# Patient Record
Sex: Male | Born: 1980 | Race: White | Hispanic: No | Marital: Single | Smoking: Former smoker
Health system: Southern US, Community
[De-identification: ages and names within clinical notes are randomized; demographics above are authoritative.]

## PROBLEM LIST (undated history)

## (undated) DIAGNOSIS — C801 Malignant (primary) neoplasm, unspecified: Secondary | ICD-10-CM

## (undated) DIAGNOSIS — N5089 Other specified disorders of the male genital organs: Secondary | ICD-10-CM

---
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
## 2014-01-16 ENCOUNTER — Other Ambulatory Visit: Payer: Self-pay | Admitting: Urology

## 2014-01-22 ENCOUNTER — Encounter (HOSPITAL_BASED_OUTPATIENT_CLINIC_OR_DEPARTMENT_OTHER): Payer: Self-pay | Admitting: *Deleted

## 2014-01-23 ENCOUNTER — Encounter (HOSPITAL_BASED_OUTPATIENT_CLINIC_OR_DEPARTMENT_OTHER): Payer: Self-pay | Admitting: *Deleted

## 2014-01-23 NOTE — Progress Notes (Signed)
NPO AFTER MN.  ARRIVE AT 1015.  NEEDS HG. 

## 2014-01-25 ENCOUNTER — Ambulatory Visit (HOSPITAL_BASED_OUTPATIENT_CLINIC_OR_DEPARTMENT_OTHER): Payer: Self-pay | Admitting: Anesthesiology

## 2014-01-25 ENCOUNTER — Encounter (HOSPITAL_BASED_OUTPATIENT_CLINIC_OR_DEPARTMENT_OTHER)
Admission: RE | Disposition: A | Discharge status: Home / Self Care | Payer: Self-pay | Source: Ambulatory Visit | Attending: Urology

## 2014-01-25 ENCOUNTER — Encounter (HOSPITAL_BASED_OUTPATIENT_CLINIC_OR_DEPARTMENT_OTHER): Payer: Self-pay | Admitting: Certified Registered"

## 2014-01-25 ENCOUNTER — Encounter (HOSPITAL_BASED_OUTPATIENT_CLINIC_OR_DEPARTMENT_OTHER): Payer: Self-pay | Admitting: Anesthesiology

## 2014-01-25 ENCOUNTER — Ambulatory Visit (HOSPITAL_BASED_OUTPATIENT_CLINIC_OR_DEPARTMENT_OTHER)
Admission: RE | Admit: 2014-01-25 | Discharge: 2014-01-25 | Disposition: A | Discharge status: Home / Self Care | Payer: Self-pay | Source: Ambulatory Visit | Attending: Urology | Admitting: Urology

## 2014-01-25 DIAGNOSIS — C6290 Malignant neoplasm of unspecified testis, unspecified whether descended or undescended: Secondary | ICD-10-CM | POA: Insufficient documentation

## 2014-01-25 DIAGNOSIS — Z87891 Personal history of nicotine dependence: Secondary | ICD-10-CM | POA: Insufficient documentation

## 2014-01-25 HISTORY — DX: Other specified disorders of the male genital organs: N50.89

## 2014-01-25 HISTORY — PX: ORCHIECTOMY: SHX2116

## 2014-01-25 LAB — POCT HEMOGLOBIN-HEMACUE: Hemoglobin: 13.3 g/dL (ref 13.0–17.0)

## 2014-01-25 SURGERY — ORCHIECTOMY
Anesthesia: General | Laterality: Left

## 2014-01-25 MED ORDER — ACETAMINOPHEN 325 MG PO TABS
650.0000 mg | ORAL_TABLET | ORAL | Status: DC | PRN
Start: 2014-01-25 — End: 2014-01-25
  Filled 2014-01-25: qty 2

## 2014-01-25 MED ORDER — CEFAZOLIN SODIUM 1-5 GM-% IV SOLN
1.0000 g | INTRAVENOUS | Status: DC
  Filled 2014-01-25: qty 50

## 2014-01-25 MED ORDER — MORPHINE SULFATE 2 MG/ML IJ SOLN
2.0000 mg | INTRAMUSCULAR | Status: DC | PRN
  Filled 2014-01-25: qty 1

## 2014-01-25 MED ORDER — HYDROMORPHONE HCL PF 1 MG/ML IJ SOLN
INTRAMUSCULAR | Status: AC
  Filled 2014-01-25: qty 1

## 2014-01-25 MED ORDER — HYDROCODONE-ACETAMINOPHEN 5-325 MG PO TABS
1.0000 | ORAL_TABLET | ORAL | Status: DC | PRN
Start: 2014-01-25 — End: 2014-05-01

## 2014-01-25 MED ORDER — OXYCODONE HCL 5 MG PO TABS
ORAL_TABLET | ORAL | Status: AC
  Filled 2014-01-25: qty 1

## 2014-01-25 MED ORDER — MIDAZOLAM HCL 2 MG/2ML IJ SOLN
INTRAMUSCULAR | Status: AC
  Filled 2014-01-25: qty 2

## 2014-01-25 MED ORDER — ONDANSETRON HCL 4 MG/2ML IJ SOLN
INTRAMUSCULAR | Status: DC | PRN
  Administered 2014-01-25: 4 mg via INTRAVENOUS

## 2014-01-25 MED ORDER — PROPOFOL 10 MG/ML IV BOLUS
INTRAVENOUS | Status: DC | PRN
  Administered 2014-01-25: 250 mg via INTRAVENOUS

## 2014-01-25 MED ORDER — SODIUM CHLORIDE 0.9 % IV SOLN
250.0000 mL | INTRAVENOUS | Status: DC | PRN
  Filled 2014-01-25: qty 250

## 2014-01-25 MED ORDER — PROMETHAZINE HCL 25 MG/ML IJ SOLN
6.2500 mg | INTRAMUSCULAR | Status: DC | PRN
  Filled 2014-01-25: qty 1

## 2014-01-25 MED ORDER — DEXAMETHASONE SODIUM PHOSPHATE 4 MG/ML IJ SOLN
INTRAMUSCULAR | Status: DC | PRN
  Administered 2014-01-25: 10 mg via INTRAVENOUS

## 2014-01-25 MED ORDER — ONDANSETRON HCL 4 MG/2ML IJ SOLN
4.0000 mg | Freq: Four times a day (QID) | INTRAMUSCULAR | Status: DC | PRN
  Filled 2014-01-25: qty 2

## 2014-01-25 MED ORDER — OXYCODONE HCL 5 MG/5ML PO SOLN
5.0000 mg | Freq: Once | ORAL | Status: DC | PRN
  Filled 2014-01-25: qty 5

## 2014-01-25 MED ORDER — FENTANYL CITRATE 0.05 MG/ML IJ SOLN
INTRAMUSCULAR | Status: DC | PRN
  Administered 2014-01-25: 50 ug via INTRAVENOUS
  Administered 2014-01-25: 25 ug via INTRAVENOUS
  Administered 2014-01-25: 50 ug via INTRAVENOUS
  Administered 2014-01-25: 25 ug via INTRAVENOUS

## 2014-01-25 MED ORDER — OXYCODONE HCL 5 MG PO TABS
5.0000 mg | ORAL_TABLET | ORAL | Status: DC | PRN
  Administered 2014-01-25: 5 mg via ORAL
  Filled 2014-01-25: qty 2

## 2014-01-25 MED ORDER — MIDAZOLAM HCL 5 MG/5ML IJ SOLN
INTRAMUSCULAR | Status: DC | PRN
  Administered 2014-01-25: 2 mg via INTRAVENOUS

## 2014-01-25 MED ORDER — BUPIVACAINE HCL (PF) 0.25 % IJ SOLN
INTRAMUSCULAR | Status: DC | PRN
  Administered 2014-01-25: 10 mL

## 2014-01-25 MED ORDER — ACETAMINOPHEN 650 MG RE SUPP
650.0000 mg | RECTAL | Status: DC | PRN
Start: 2014-01-25 — End: 2014-01-25
  Filled 2014-01-25: qty 1

## 2014-01-25 MED ORDER — HYDROMORPHONE HCL PF 1 MG/ML IJ SOLN
0.2500 mg | INTRAMUSCULAR | Status: DC | PRN
  Administered 2014-01-25 (×2): 0.25 mg via INTRAVENOUS
  Filled 2014-01-25: qty 1

## 2014-01-25 MED ORDER — OXYCODONE HCL 5 MG PO TABS
5.0000 mg | ORAL_TABLET | Freq: Once | ORAL | Status: DC | PRN
  Filled 2014-01-25: qty 1

## 2014-01-25 MED ORDER — LIDOCAINE HCL (CARDIAC) 20 MG/ML IV SOLN
INTRAVENOUS | Status: DC | PRN
  Administered 2014-01-25: 80 mg via INTRAVENOUS

## 2014-01-25 MED ORDER — HYDROCODONE-ACETAMINOPHEN 5-325 MG PO TABS
1.0000 | ORAL_TABLET | ORAL | Status: DC | PRN
  Filled 2014-01-25: qty 2

## 2014-01-25 MED ORDER — MEPERIDINE HCL 25 MG/ML IJ SOLN
6.2500 mg | INTRAMUSCULAR | Status: DC | PRN
  Filled 2014-01-25: qty 1

## 2014-01-25 MED ORDER — SODIUM CHLORIDE 0.9 % IJ SOLN
3.0000 mL | INTRAMUSCULAR | Status: DC | PRN
  Filled 2014-01-25: qty 3

## 2014-01-25 MED ORDER — FENTANYL CITRATE 0.05 MG/ML IJ SOLN
INTRAMUSCULAR | Status: AC
  Filled 2014-01-25: qty 6

## 2014-01-25 MED ORDER — LACTATED RINGERS IV SOLN
INTRAVENOUS | Status: DC
  Administered 2014-01-25 (×2): via INTRAVENOUS
  Filled 2014-01-25: qty 1000

## 2014-01-25 MED ORDER — SODIUM CHLORIDE 0.9 % IJ SOLN
3.0000 mL | Freq: Two times a day (BID) | INTRAMUSCULAR | Status: DC
  Filled 2014-01-25: qty 3

## 2014-01-25 MED ORDER — CEFAZOLIN SODIUM-DEXTROSE 2-3 GM-% IV SOLR
2.0000 g | INTRAVENOUS | Status: AC
  Administered 2014-01-25: 2 g via INTRAVENOUS
  Filled 2014-01-25: qty 50

## 2014-01-25 SURGICAL SUPPLY — 42 items
APPLICATOR COTTON TIP 6IN STRL (MISCELLANEOUS) IMPLANT
BANDAGE GAUZE ELAST BULKY 4 IN (GAUZE/BANDAGES/DRESSINGS) ×3 IMPLANT
BLADE SURG 15 STRL LF DISP TIS (BLADE) ×1 IMPLANT
BLADE SURG 15 STRL SS (BLADE) ×2
BLADE SURG ROTATE 9660 (MISCELLANEOUS) ×3 IMPLANT
CLOTH BEACON ORANGE TIMEOUT ST (SAFETY) ×3 IMPLANT
COVER MAYO STAND STRL (DRAPES) ×3 IMPLANT
COVER TABLE BACK 60X90 (DRAPES) ×3 IMPLANT
DERMABOND ADVANCED (GAUZE/BANDAGES/DRESSINGS) ×2
DERMABOND ADVANCED .7 DNX12 (GAUZE/BANDAGES/DRESSINGS) ×1 IMPLANT
DISSECTOR ROUND CHERRY 3/8 STR (MISCELLANEOUS) ×3 IMPLANT
DRAPE PED LAPAROTOMY (DRAPES) ×3 IMPLANT
DRSG TEGADERM 4X4.75 (GAUZE/BANDAGES/DRESSINGS) ×3 IMPLANT
ELECT NEEDLE TIP 2.8 STRL (NEEDLE) ×3 IMPLANT
ELECT REM PT RETURN 9FT ADLT (ELECTROSURGICAL) ×3
ELECTRODE REM PT RTRN 9FT ADLT (ELECTROSURGICAL) ×1 IMPLANT
GAUZE SPONGE 4X4 16PLY XRAY LF (GAUZE/BANDAGES/DRESSINGS) ×3 IMPLANT
GLOVE BIO SURGEON STRL SZ8 (GLOVE) ×3 IMPLANT
GLOVE BIOGEL PI IND STRL 7.5 (GLOVE) ×2 IMPLANT
GLOVE BIOGEL PI INDICATOR 7.5 (GLOVE) ×4
GOWN STRL REIN XL XLG (GOWN DISPOSABLE) ×3 IMPLANT
GOWN STRL REUS W/TWL XL LVL3 (GOWN DISPOSABLE) ×6 IMPLANT
NEEDLE HYPO 25X5/8 SAFETYGLIDE (NEEDLE) ×3 IMPLANT
NS IRRIG 500ML POUR BTL (IV SOLUTION) ×3 IMPLANT
PACK BASIN DAY SURGERY FS (CUSTOM PROCEDURE TRAY) ×3 IMPLANT
PENCIL BUTTON HOLSTER BLD 10FT (ELECTRODE) ×3 IMPLANT
SPONGE GAUZE 4X4 12PLY (GAUZE/BANDAGES/DRESSINGS) ×3 IMPLANT
SUPPORT SCROTAL LG STRP (MISCELLANEOUS) ×2 IMPLANT
SUPPORTER ATHLETIC LG (MISCELLANEOUS) ×1
SUT CHROMIC 3 0 SH 27 (SUTURE) ×3 IMPLANT
SUT CHROMIC 4 0 SH 27 (SUTURE) ×3 IMPLANT
SUT MON AB 5-0 PS2 18 (SUTURE) ×3 IMPLANT
SUT SILK 0 TIES 10X30 (SUTURE) ×3 IMPLANT
SUT VICRYL 2 0 18  UND BR (SUTURE) ×2
SUT VICRYL 2 0 18 UND BR (SUTURE) ×1 IMPLANT
SYR BULB IRRIGATION 50ML (SYRINGE) ×3 IMPLANT
SYR CONTROL 10ML LL (SYRINGE) ×3 IMPLANT
TOWEL OR 17X24 6PK STRL BLUE (TOWEL DISPOSABLE) ×6 IMPLANT
TUBE CONNECTING 12'X1/4 (SUCTIONS) ×1
TUBE CONNECTING 12X1/4 (SUCTIONS) ×2 IMPLANT
WATER STERILE IRR 500ML POUR (IV SOLUTION) IMPLANT
YANKAUER SUCT BULB TIP NO VENT (SUCTIONS) ×3 IMPLANT

## 2014-01-25 NOTE — H&P (Signed)
Urology History and Physical Exam  CC: left testicular mass  HPI: 33 year old male presents at this time for left radical orchiectomy.  He presented last week with a painless swelling of his left testicle which she stated had only been that way for a week or 2.  Evaluation revealed a firm left testicle on examination, as well as significant changes with ultrasound.  Both beta hCG and alpha-fetoprotein levels were significantly elevated.  LDH was normal.  He presents at this time for surgical extirpation of his testicle and advance of CT chest abdomen and pelvis.    PMH: Past Medical History  Diagnosis Date  . Testicular mass     LEFT    PSH: Past Surgical History  Procedure Laterality Date  . Orif left ankle fx  1996    REMOVAL HARDWARE IN 1999    Allergies: Allergies  Allergen Reactions  . Amoxicillin Rash  . Erythromycin Rash    Medications: No prescriptions prior to admission     Social History: History   Social History  . Marital Status: Single    Spouse Name: N/A    Number of Children: N/A  . Years of Education: N/A   Occupational History  . Not on file.   Social History Main Topics  . Smoking status: Former Smoker -- 0.50 packs/day for 10 years    Types: Cigarettes    Quit date: 01/23/2013  . Smokeless tobacco: Never Used  . Alcohol Use: 1.0 oz/week    2 drink(s) per week  . Drug Use: No  . Sexual Activity: Not on file   Other Topics Concern  . Not on file   Social History Narrative  . No narrative on file    Family History: History reviewed. No pertinent family history.  Review of Systems: Genitourinary, constitutional, skin, eye, otolaryngeal, hematologic/lymphatic, cardiovascular, pulmonary, endocrine, musculoskeletal, gastrointestinal, neurological and psychiatric system(s) were reviewed and pertinent findings if present are noted.  Genitourinary: testicular pain and scrotal swelling.  ENT: sinus problems.  Hematologic/Lymphatic:  swollen glands.      Physical Exam: @VITALS2 @ Constitutional: Well nourished and well developed . No acute distress.  ENT:. The ears and nose are normal in appearance.  Neck: The appearance of the neck is normal and no neck mass is present.  Pulmonary: No respiratory distress and normal respiratory rhythm and effort.  Cardiovascular: Heart rate and rhythm are normal . No peripheral edema.  Abdomen: The abdomen is flat. The abdomen is soft and nontender. No masses are palpated. No CVA tenderness. No hernias are palpable. No hepatosplenomegaly noted.  Rectal: The prostate exam was deferred.  Genitourinary: Examination of the penis demonstrates no discharge, no masses and a normal meatus. The penis is circumcised. 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 M The scrotum is normal in appearance and without lesions. The right vas deferens is is palpably normal. The left vas deferens is palpably normal. The right epididymis is palpably normal and non-tender. The left epididymis is palpably normal and non-tender. The right spermatic cord is palpably normal. The left spermatic cord is palpably normal. The right testis is palpably normal, non-tender and without masses. The left testis is found to have a 3.5 cm mass, but non-tender.  Lymphatics: The femoral and inguinal nodes are not enlarged or tender.  Skin: Normal skin turgor, no visible rash and no visible skin lesions.  Neuro/Psych:. Mood and affect are appropriate.   Studies:  No results found  for this basename: HGB, WBC, PLT,  in the last 72 hours  No results found for this basename: NA, K, CL, CO2, BUN, CREATININE, CALCIUM, MAGNESIUM, GFRNONAA, GFRAA,  in the last 72 hours   No results found for this basename: PT, INR, APTT,  in the last 72 hours   No components found with this basename: ABG,     Assessment:  Left testicular mass consistent with carcinoma  Plan: Left radical orchiectomy.  Risks  and complications of the procedure have been discussed with the patient.  These include but are not limited to bleeding, infection, neuropathy from ileal inguinal nerve entrapment.  He understands these and desires to proceed.

## 2014-01-25 NOTE — Anesthesia Postprocedure Evaluation (Signed)
Anesthesia Post Note  Patient: Eric Wilkinson  Procedure(s) Performed: Procedure(s) (LRB): ORCHIECTOMY (Left)  Anesthesia type: General  Patient location: PACU  Post pain: Pain level controlled  Post assessment: Post-op Vital signs reviewed  Last Vitals: BP 122/58  Pulse 58  Temp(Src) 36.6 C (Oral)  Resp 6  Ht 6' 3.5" (1.918 m)  Wt 220 lb (99.791 kg)  BMI 27.13 kg/m2  SpO2 97%  Post vital signs: Reviewed  Level of consciousness: sedated  Complications: No apparent anesthesia complications

## 2014-01-25 NOTE — Op Note (Signed)
Preoperative diagnosis: left testicular mass, likely testicular carcinoma  Postoperative diagnosis: Same   Procedure: Left radical orchiectomy    Surgeon: Lillette Boxer. Tressia Labrum, M.D.   Anesthesia: Gen.   Complications: None  Specimen(s): Left testicle and cord  Drain(s): None  Indications: 33 year old male recently presenting with a left testicular mass. Evaluation included physical exam and ultrasound of the scrotum, both suspicious for cancer. Both beta hCG and alpha-fetoprotein were elevated. He presents at this time for left inguinal orchiectomy. He is aware of risks and complications and desires to proceed.    Technique and findings: The patient was identified in the holding area. He received preoperative IV antibiotics, his left side was marked appropriately. He was taken to the operating room where general anesthetic was administered with the LMA. He was placed in the recumbent position. Left lower abdomen, genitalia and perineum were prepped and draped, proper timeout was performed.  A 3 cm incision was made just overlying the left external inguinal ring, and using blunt dissection I carried this down to the left spermatic cord. The cord was identified, circumferentially dissected, and I then placed a half-inch Penrose drain tightly on the cord just at the inguinal ring. I then dissected inferiorly along the cord. Using external pressure on the scrotum, the left testicle was delivered into the wound. The gubernaculum was then divided carefully. I inspected the everted scrotum, as well as a inguinal floor. No bleeding was seen. I then infiltrated, using 3 cc of quarter percent Marcaine, the proximal cord. I then clamped the cord in 2 separate packages with Kelly clamps. I then divided distal to these clamps. The cord and testicle were sent for permanent specimen. I doubly ligated each cord packet with 0 silk ties. I then pushed the stump of the cord up into the inguinal canal. This was  easily achieved. I then reinspected the entire site of dissection. Adequate hemostasis was noted. 7 cc of quarter percent Marcaine was then used to infiltrate the subcutaneous and skin tissues. 3-0 chromic was used to reapproximate the subcutaneous tissues and a running simple fashion. I then reapproximated the skin edges using 5-0 Monocryl in a running subcuticular fashion. Dermabond was then placed on the skin. A fluffy dressing was then placed over the scrotum, as well as a scrotal support.  The patient tolerated procedure well. He was taken the PACU in stable condition. We will followup over the phone, ordering appropriate cross-sectional imaging, I will followup with biopsy results as well.

## 2014-01-25 NOTE — Transfer of Care (Signed)
Immediate Anesthesia Transfer of Care Note  Patient: Eric Wilkinson  Procedure(s) Performed: Procedure(s) (LRB): ORCHIECTOMY (Left)  Patient Location: PACU  Anesthesia Type: General  Level of Consciousness: awake, oriented, sedated and patient cooperative  Airway & Oxygen Therapy: Patient Spontanous Breathing and Patient connected to face mask oxygen  Post-op Assessment: Report given to PACU RN and Post -op Vital signs reviewed and stable  Post vital signs: Reviewed and stable  Complications: No apparent anesthesia complications

## 2014-01-25 NOTE — Discharge Instructions (Addendum)
1. Wear a scrotal support or tight briefs for a few days.  2. It is okay to shower tomorrow.  3. For mild pain, you can take Advil or Aleve. For more severe pain, you can take the prescription for hydrocodone  4. Limit your heavy exertion for about a week. You may return to work in 2-3 days if you're not uncomfortable. Just do not lift anything over about 20 pounds.  5. Dr. Alan Ripper office will call you with results of the specimen as well as followup/x-ray scheduling Post Anesthesia Home Care Instructions  Activity: Get plenty of rest for the remainder of the day. A responsible adult should stay with you for 24 hours following the procedure.  For the next 24 hours, DO NOT: -Drive a car -Paediatric nurse -Drink alcoholic beverages -Take any medication unless instructed by your physician -Make any legal decisions or sign important papers.  Meals: Start with liquid foods such as gelatin or soup. Progress to regular foods as tolerated. Avoid greasy, spicy, heavy foods. If nausea and/or vomiting occur, drink only clear liquids until the nausea and/or vomiting subsides. Call your physician if vomiting continues.  Special Instructions/Symptoms: Your throat may feel dry or sore from the anesthesia or the breathing tube placed in your throat during surgery. If this causes discomfort, gargle with warm salt water. The discomfort should disappear within 24 hours.

## 2014-01-25 NOTE — Anesthesia Procedure Notes (Signed)
Procedure Name: LMA Insertion Date/Time: 01/25/2014 11:39 AM Performed by: Denna Haggard D Pre-anesthesia Checklist: Patient identified, Emergency Drugs available, Suction available and Patient being monitored Patient Re-evaluated:Patient Re-evaluated prior to inductionOxygen Delivery Method: Circle System Utilized Preoxygenation: Pre-oxygenation with 100% oxygen Intubation Type: IV induction Ventilation: Mask ventilation without difficulty LMA: LMA inserted LMA Size: 5.0 Number of attempts: 1 Airway Equipment and Method: bite block Placement Confirmation: positive ETCO2 Tube secured with: Tape Dental Injury: Teeth and Oropharynx as per pre-operative assessment

## 2014-01-25 NOTE — Anesthesia Preprocedure Evaluation (Signed)
Anesthesia Evaluation  Patient identified by MRN, date of birth, ID band Patient awake    Reviewed: Allergy & Precautions, H&P , NPO status , Patient's Chart, lab work & pertinent test results  Airway Mallampati: I TM Distance: >3 FB Neck ROM: Full    Dental  (+) Dental Advisory Given   Pulmonary former smoker,  breath sounds clear to auscultation        Cardiovascular negative cardio ROS  Rhythm:Regular Rate:Normal     Neuro/Psych negative neurological ROS  negative psych ROS   GI/Hepatic negative GI ROS, Neg liver ROS,   Endo/Other  negative endocrine ROS  Renal/GU negative Renal ROS     Musculoskeletal negative musculoskeletal ROS (+)   Abdominal   Peds  Hematology negative hematology ROS (+)   Anesthesia Other Findings   Reproductive/Obstetrics                           Anesthesia Physical Anesthesia Plan  ASA: I  Anesthesia Plan: General   Post-op Pain Management:    Induction: Intravenous  Airway Management Planned: LMA  Additional Equipment:   Intra-op Plan:   Post-operative Plan: Extubation in OR  Informed Consent: I have reviewed the patients History and Physical, chart, labs and discussed the procedure including the risks, benefits and alternatives for the proposed anesthesia with the patient or authorized representative who has indicated his/her understanding and acceptance.   Dental advisory given  Plan Discussed with: CRNA  Anesthesia Plan Comments:         Anesthesia Quick Evaluation

## 2014-01-28 ENCOUNTER — Encounter (HOSPITAL_BASED_OUTPATIENT_CLINIC_OR_DEPARTMENT_OTHER): Payer: Self-pay | Admitting: Urology

## 2014-01-31 ENCOUNTER — Other Ambulatory Visit: Payer: Self-pay | Admitting: Urology

## 2014-01-31 DIAGNOSIS — C801 Malignant (primary) neoplasm, unspecified: Secondary | ICD-10-CM

## 2014-02-04 ENCOUNTER — Encounter (HOSPITAL_COMMUNITY): Payer: Self-pay

## 2014-02-04 ENCOUNTER — Ambulatory Visit (HOSPITAL_COMMUNITY)
Admission: RE | Admit: 2014-02-04 | Discharge: 2014-02-04 | Disposition: A | Discharge status: Home / Self Care | Payer: Self-pay | Source: Ambulatory Visit | Attending: Urology | Admitting: Urology

## 2014-02-04 DIAGNOSIS — C6290 Malignant neoplasm of unspecified testis, unspecified whether descended or undescended: Secondary | ICD-10-CM | POA: Insufficient documentation

## 2014-02-04 DIAGNOSIS — Z9079 Acquired absence of other genital organ(s): Secondary | ICD-10-CM | POA: Insufficient documentation

## 2014-02-04 DIAGNOSIS — C801 Malignant (primary) neoplasm, unspecified: Secondary | ICD-10-CM

## 2014-02-04 MED ORDER — IOHEXOL 300 MG/ML  SOLN
50.0000 mL | Freq: Once | INTRAMUSCULAR | Status: AC | PRN
  Administered 2014-02-04: 50 mL via ORAL

## 2014-02-04 MED ORDER — IOHEXOL 300 MG/ML  SOLN
100.0000 mL | Freq: Once | INTRAMUSCULAR | Status: AC | PRN
  Administered 2014-02-04: 100 mL via INTRAVENOUS

## 2014-04-24 ENCOUNTER — Other Ambulatory Visit: Payer: Self-pay | Admitting: Urology

## 2014-04-24 DIAGNOSIS — C801 Malignant (primary) neoplasm, unspecified: Secondary | ICD-10-CM

## 2014-04-25 ENCOUNTER — Telehealth: Payer: Self-pay | Admitting: Oncology

## 2014-04-25 NOTE — Telephone Encounter (Signed)
LEFT MESSAGE FOR PATIENT TO RETURN CALL TO SCHEDULE NP APPT.  °

## 2014-04-29 ENCOUNTER — Telehealth: Payer: Self-pay | Admitting: Oncology

## 2014-04-29 NOTE — Telephone Encounter (Signed)
S/W PATIENT AND GAVE NP APPT FOR 06/10 @ 1:30 W/DR. SHADAD.  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

## 2014-04-29 NOTE — Telephone Encounter (Signed)
C/D 04/29/14 for appt. 05/01/14

## 2014-05-01 ENCOUNTER — Telehealth: Payer: Self-pay | Admitting: Oncology

## 2014-05-01 ENCOUNTER — Encounter: Payer: Self-pay | Admitting: Oncology

## 2014-05-01 ENCOUNTER — Ambulatory Visit (HOSPITAL_COMMUNITY): Payer: Self-pay

## 2014-05-01 ENCOUNTER — Other Ambulatory Visit: Payer: Self-pay

## 2014-05-01 ENCOUNTER — Ambulatory Visit (HOSPITAL_BASED_OUTPATIENT_CLINIC_OR_DEPARTMENT_OTHER): Payer: Self-pay | Admitting: Oncology

## 2014-05-01 ENCOUNTER — Ambulatory Visit: Payer: Self-pay

## 2014-05-01 VITALS — BP 152/76 | HR 74 | Temp 97.5°F | Resp 20 | Ht 75.5 in | Wt 222.1 lb

## 2014-05-01 DIAGNOSIS — C6290 Malignant neoplasm of unspecified testis, unspecified whether descended or undescended: Secondary | ICD-10-CM | POA: Insufficient documentation

## 2014-05-01 NOTE — Telephone Encounter (Signed)
Gave pt appt for lab and MD for September 2015

## 2014-05-01 NOTE — Progress Notes (Signed)
Please see consult note.  

## 2014-05-01 NOTE — Consult Note (Signed)
Reason for Referral: Testicular cancer.   HPI: 33 year old gentleman native of [place] currently lives in [place] the last year and a half. He is a healthy gentleman without any significant past medical history. Around February of 2015 he presented with a large left testicular mass after a mild trauma. He was evaluated by her urging care and was subsequently referred to Dr. Luberta Robertson from urology. Testicular ultrasound revealed a suspicious mass for a malignancy. On 01/25/2014 he underwent a left radical orchiectomy which she tolerated fairly well. 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 No angiolymphatic invasion. And 30% embryonal, 45% yolk sac and 20% seminoma. The tumor was limited to the testes with a pathological staging of T1. He underwent a CT scan images in March of 2015 which showed no evidence of any lymphatic or any sort of metastasis. He recovered well from surgery and was referred to me for discussion for adjuvant therapy.  Clinically, he is asymptomatic. He does not report any headaches blurred vision double vision. Did not report any neurological symptoms of ulceration mental status, psychiatric issues or depression. He does not report any chest pain shortness of breath or cough or hemoptysis. Does not report any palpitation leg edema orthopnea. Does not report any nausea or vomiting abdominal pain. Has not reported any hematochezia or melena. Does not report any frequency urgency or hesitancy. Does not report any musculoskeletal complaints. Does not report any leg edema, lymphadenopathy or skin rashes. The rest of his review of systems unremarkable. 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   Past Medical History  Diagnosis Date  . Testicular mass     LEFT  :  Past Surgical History  Procedure Laterality Date  . Orif left ankle fx  [place]  . Orchiectomy Left 01/25/2014    Procedure: ORCHIECTOMY;  Surgeon: Franchot Gallo, MD;  Location: [facility];  Service: Urology;  Laterality: Left;  :  No current outpatient prescriptions on file.:  Allergies  Allergen Reactions  . Amoxicillin Rash  . Erythromycin Rash  :  No family history on file.:  History   Social History  . Marital Status: Single    Spouse Name: N/A    Number of Children: N/A  . Years of Education: N/A   Occupational History  . Not on file.   Social History Main Topics  . Smoking status: Former Smoker -- 0.50 packs/day for 10 years    Types: Cigarettes    Quit date: 01/23/2013  . Smokeless tobacco: Never Used  . Alcohol Use: 1.0 oz/week    2 drink(s) per week  . Drug Use: No  . Sexual Activity: Not on file   Other Topics Concern  . Not on file   Social History Narrative  . No narrative on file  :  Pertinent items are noted in HPI.  Exam: ECOG 0 Blood pressure 152/76, pulse 74, temperature 97.5 F (36.4 C), temperature source Oral, resp. rate 20, height 6' 3.5" (1.918 m), weight 222 lb 1.6 oz (100.744 kg). General appearance: alert and cooperative Head: Normocephalic, without obvious abnormality Throat: lips, mucosa, and tongue normal; teeth and gums normal Neck: no adenopathy Back: symmetric, no curvature. ROM normal. No CVA tenderness. Resp: clear to auscultation bilaterally Chest wall: no tenderness Cardio: regular rate and rhythm, S1, S2 normal, no murmur, click, rub or gallop  GI: soft, non-tender; bowel sounds normal; no masses,  no organomegaly Extremities: extremities normal, atraumatic, no cyanosis or edema Pulses: 2+ and symmetric Lymph nodes: Cervical, supraclavicular, and axillary nodes normal.    Assessment and Plan:    33 year old gentleman with the following issues:  1. 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 He presented with a left testicular mass and underwent an orchiectomy which  showed a mixed germ cell tumor. His embryonal component is about 30% without lymphovascular invasion. His clinical staging is T1 N0. His tumor markers showed an elevated alpha-fetoprotein of 874 prior to surgery and declined to 4.2 on 04/24/2014. His beta hCG was elevated at 79 preoperatively as well. The natural course of testicular cancer was discussed and the treatment options for early stage mixed germ cell tumor was discussed. After orchiectomy, the options in this situation would include observation and surveillance, retroperitoneal lymph node dissection and adjuvant chemotherapy with one cycle of BEP. The risks and benefits of all these approaches were discussed and I feel given the fact that he has no lymphovascular invasion and a low embryonal component, his risk of recurrence would be between 15 and 20%. Adjuvant therapy will over treat somebody like him more than 80% of the time. In this particular situation, especially in the setting of 3 months delay from his operation, I would recommend observation and surveillance. The complications of systemic chemotherapy were discussed briefly today including nausea, vomiting, myelosuppression, thrombosis and rarely severe illness and death. The details of observation and surveillance were discussed which include CT scans every 3-4 months and the first 2 years as well as clinical exams and serum tumor markers with frequency decreasing after that. Surveillance will have to be up to 5 years at least.  He is already scheduled for his CT scan in June I will communicate the results for him once I get.  He prefers to initiate active surveillance at the [facility] and I will set him up with his followup in September of 2015.   All his questions were answered today to his satisfaction.  2. Survivorship/health maintenance issues: I continue to advise him about age-appropriate cancer screening among health preventative processes.

## 2014-05-02 ENCOUNTER — Encounter (HOSPITAL_COMMUNITY): Payer: Self-pay

## 2014-05-02 ENCOUNTER — Ambulatory Visit (HOSPITAL_COMMUNITY)
Admission: RE | Admit: 2014-05-02 | Discharge: 2014-05-02 | Disposition: A | Discharge status: Home / Self Care | Payer: Self-pay | Source: Ambulatory Visit | Attending: Urology | Admitting: Urology

## 2014-05-02 DIAGNOSIS — C801 Malignant (primary) neoplasm, unspecified: Secondary | ICD-10-CM

## 2014-05-02 DIAGNOSIS — Z9079 Acquired absence of other genital organ(s): Secondary | ICD-10-CM | POA: Insufficient documentation

## 2014-05-02 DIAGNOSIS — J984 Other disorders of lung: Secondary | ICD-10-CM | POA: Insufficient documentation

## 2014-05-02 DIAGNOSIS — J438 Other emphysema: Secondary | ICD-10-CM | POA: Insufficient documentation

## 2014-05-02 DIAGNOSIS — C6290 Malignant neoplasm of unspecified testis, unspecified whether descended or undescended: Secondary | ICD-10-CM | POA: Insufficient documentation

## 2014-05-02 HISTORY — DX: Malignant (primary) neoplasm, unspecified: C80.1

## 2014-05-02 MED ORDER — IOHEXOL 300 MG/ML  SOLN
100.0000 mL | Freq: Once | INTRAMUSCULAR | Status: AC | PRN
  Administered 2014-05-02: 100 mL via INTRAVENOUS

## 2014-05-06 ENCOUNTER — Other Ambulatory Visit: Payer: Self-pay

## 2014-07-18 ENCOUNTER — Telehealth: Payer: Self-pay | Admitting: Oncology

## 2014-07-18 NOTE — Telephone Encounter (Signed)
Lft msg for pt r/s ov per MD sch, mailed sch to pt.Marland Kitchen..KJ

## 2014-08-06 ENCOUNTER — Ambulatory Visit (HOSPITAL_COMMUNITY): Payer: Self-pay

## 2014-08-06 ENCOUNTER — Other Ambulatory Visit: Payer: Self-pay

## 2014-08-09 ENCOUNTER — Telehealth: Payer: Self-pay | Admitting: Oncology

## 2014-08-09 NOTE — Telephone Encounter (Signed)
Pt called to r/s labs/ov due to work conflict, pt confirmed r/s apt.Marland Kitchen..KJ

## 2014-08-12 ENCOUNTER — Other Ambulatory Visit: Payer: Self-pay

## 2014-08-12 ENCOUNTER — Ambulatory Visit (HOSPITAL_COMMUNITY): Payer: Self-pay

## 2014-08-13 ENCOUNTER — Ambulatory Visit: Payer: Self-pay | Admitting: Oncology

## 2014-08-19 ENCOUNTER — Ambulatory Visit: Payer: Self-pay | Admitting: Oncology

## 2014-08-23 ENCOUNTER — Ambulatory Visit: Payer: Self-pay | Admitting: Oncology

## 2014-09-04 ENCOUNTER — Ambulatory Visit (HOSPITAL_COMMUNITY)
Admission: RE | Admit: 2014-09-04 | Discharge: 2014-09-04 | Disposition: A | Discharge status: Home / Self Care | Payer: Self-pay | Source: Ambulatory Visit | Attending: Oncology | Admitting: Oncology

## 2014-09-04 ENCOUNTER — Other Ambulatory Visit (HOSPITAL_BASED_OUTPATIENT_CLINIC_OR_DEPARTMENT_OTHER): Payer: Self-pay

## 2014-09-04 ENCOUNTER — Encounter (HOSPITAL_COMMUNITY): Payer: Self-pay

## 2014-09-04 DIAGNOSIS — C6290 Malignant neoplasm of unspecified testis, unspecified whether descended or undescended: Secondary | ICD-10-CM

## 2014-09-04 LAB — COMPREHENSIVE METABOLIC PANEL (CC13)
ALK PHOS: 47 U/L (ref 40–150)
ALT: 27 U/L (ref 0–55)
AST: 22 U/L (ref 5–34)
Albumin: 4.3 g/dL (ref 3.5–5.0)
Anion Gap: 8 mEq/L (ref 3–11)
BUN: 21.4 mg/dL (ref 7.0–26.0)
CHLORIDE: 106 meq/L (ref 98–109)
CO2: 26 mEq/L (ref 22–29)
Calcium: 10 mg/dL (ref 8.4–10.4)
Creatinine: 0.9 mg/dL (ref 0.7–1.3)
Glucose: 90 mg/dl (ref 70–140)
Potassium: 4.7 mEq/L (ref 3.5–5.1)
Sodium: 141 mEq/L (ref 136–145)
Total Bilirubin: 0.51 mg/dL (ref 0.20–1.20)
Total Protein: 7.9 g/dL (ref 6.4–8.3)

## 2014-09-04 LAB — CBC WITH DIFFERENTIAL/PLATELET
BASO%: 0.7 % (ref 0.0–2.0)
BASOS ABS: 0.1 10*3/uL (ref 0.0–0.1)
EOS%: 1.1 % (ref 0.0–7.0)
Eosinophils Absolute: 0.1 10*3/uL (ref 0.0–0.5)
HEMATOCRIT: 42.7 % (ref 38.4–49.9)
HEMOGLOBIN: 14.1 g/dL (ref 13.0–17.1)
LYMPH%: 18.7 % (ref 14.0–49.0)
MCH: 30.1 pg (ref 27.2–33.4)
MCHC: 32.9 g/dL (ref 32.0–36.0)
MCV: 91.3 fL (ref 79.3–98.0)
MONO#: 0.6 10*3/uL (ref 0.1–0.9)
MONO%: 7.4 % (ref 0.0–14.0)
NEUT#: 5.6 10*3/uL (ref 1.5–6.5)
NEUT%: 72.1 % (ref 39.0–75.0)
Platelets: 182 10*3/uL (ref 140–400)
RBC: 4.67 10*6/uL (ref 4.20–5.82)
RDW: 12.9 % (ref 11.0–14.6)
WBC: 7.8 10*3/uL (ref 4.0–10.3)
lymph#: 1.5 10*3/uL (ref 0.9–3.3)

## 2014-09-04 LAB — LACTATE DEHYDROGENASE (CC13): LDH: 120 U/L — AB (ref 125–245)

## 2014-09-04 MED ORDER — IOHEXOL 300 MG/ML  SOLN
100.0000 mL | Freq: Once | INTRAMUSCULAR | Status: AC | PRN
  Administered 2014-09-04: 100 mL via INTRAVENOUS

## 2014-09-07 LAB — BETA HCG QUANT (REF LAB): Beta hCG, Tumor Marker: <2 m[IU]/mL (ref <5.0)

## 2014-09-07 LAB — AFP TUMOR MARKER: AFP TUMOR MARKER: 4.2 ng/mL (ref <6.1)

## 2014-09-07 LAB — AFP TUMOR MARKER-PREVIOUS METHOD: AFP Tumor Marker: 3.3 ng/mL (ref 0.0–8.0)

## 2014-09-11 ENCOUNTER — Telehealth: Payer: Self-pay | Admitting: Oncology

## 2014-09-11 ENCOUNTER — Ambulatory Visit (HOSPITAL_BASED_OUTPATIENT_CLINIC_OR_DEPARTMENT_OTHER): Payer: Self-pay | Admitting: Oncology

## 2014-09-11 ENCOUNTER — Encounter: Payer: Self-pay | Admitting: Oncology

## 2014-09-11 VITALS — BP 131/63 | HR 73 | Temp 98.1°F | Resp 18 | Ht 75.5 in | Wt 222.8 lb

## 2014-09-11 DIAGNOSIS — C6290 Malignant neoplasm of unspecified testis, unspecified whether descended or undescended: Secondary | ICD-10-CM

## 2014-09-11 NOTE — Telephone Encounter (Signed)
gv adn psrinted appt sched and avs fo rpt for Feb adn March .Marland Kitchen...gv pt barium

## 2014-09-11 NOTE — Progress Notes (Signed)
Hematology and Oncology Follow Up Visit  Eric Wilkinson [id number] 02-21-1981 33 y.o. 09/11/2014 1:29 PM No PCP Per PatientNo ref. 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 He was found to have a T1 N0 with 30% embryonal component45% yolk sac and 20% seminoma without lymphovascular invasion. Diagnosed in March of 2015.   Prior Therapy: On 01/25/2014 he underwent a left radical orchiectomy    Current therapy: Observation and surveillance.  Interim History:  Eric Wilkinson presents today for a followup visit. Since his last visit, he is completely asymptomatic. He reports no change in his health or signs of cancer relapse. He did not report any inguinal masses or abnormal adenopathy. He was able to enjoy a vacation to [place] without issues. He does not report any headaches blurred vision double vision. Did not report any neurological symptoms of ulceration mental status, psychiatric issues or depression. He does not report any chest pain shortness of breath or cough or hemoptysis. Does not report any palpitation leg edema orthopnea. Does not report any nausea or vomiting abdominal pain. Has not reported any hematochezia or melena. Does not report any frequency urgency or hesitancy. Does not report any musculoskeletal complaints. Does not report any leg edema, lymphadenopathy or skin rashes. The rest of his review of systems unremarkable. 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    Medications: I have reviewed the patient's current medications.    Allergies:  Allergies  Allergen Reactions  . Amoxicillin Rash  . Erythromycin Rash    Past Medical History, Surgical history, Social history, and Family History were reviewed and updated.   Physical Exam: Blood pressure 131/63, pulse 73, temperature 98.1 F (36.7 C), temperature source Oral, resp. rate 18, height 6' 3.5" (1.918  m), weight 222 lb 12.8 oz (101.061 kg). ECOG: 0 General appearance: alert and cooperative Head: Normocephalic, without obvious abnormality Neck: no adenopathy Lymph nodes: Cervical, supraclavicular, and axillary nodes normal. Heart:regular rate and rhythm, S1, S2 normal, no murmur, click, rub or gallop Lung:chest clear, no wheezing, rales, normal symmetric air entry Abdomin: soft, non-tender, without masses or organomegaly EXT:no erythema, induration, or nodules   Lab Results: Lab Results  Component Value Date   WBC 7.8 09/04/2014   HGB 14.1 09/04/2014   HCT 42.7 09/04/2014   MCV 91.3 09/04/2014   PLT 182 09/04/2014     Chemistry      Component Value Date/Time   NA 141 09/04/2014 1041   K 4.7 09/04/2014 1041   CO2 26 09/04/2014 1041   BUN 21.4 09/04/2014 1041   CREATININE 0.9 09/04/2014 1041      Component Value Date/Time   CALCIUM 10.0 09/04/2014 1041   ALKPHOS 47 09/04/2014 1041   AST 22 09/04/2014 1041   ALT 27 09/04/2014 1041   BILITOT 0.51 09/04/2014 1041     Results for Eric Wilkinson, Eric Wilkinson (MRN [id number]) as of 09/11/2014 13:34  Ref. Range 09/04/2014 10:41 09/04/2014 10:41  AFP Tumor Marker Latest Range: <6.1 ng/mL 4.2 3.3  Beta hCG, Tumor Marker Latest Range: < 5.0 mIU/mL < 2.0     Radiological Studies:  EXAM:  CT CHEST, ABDOMEN, AND PELVIS WITH CONTRAST  TECHNIQUE:  Multidetector CT imaging of the chest, abdomen and pelvis was  performed following the standard protocol during bolus  administration of intravenous contrast.  CONTRAST: 173mL OMNIPAQUE IOHEXOL 300 MG/ML SOLN  COMPARISON: 05/02/2014  FINDINGS:  CT CHEST FINDINGS  Lungs/Pleura: No suspicious pulmonary nodule. Subtle interstitial  opacity at the right lung base is improved, including on image 50.  May have related to an area of infection on the prior exam.  No pleural fluid.  Heart/Mediastinum: No supraclavicular adenopathy. No axillary  adenopathy. 7 mm high left paratracheal node is unchanged  on image  9. Not pathologic by size criteria. No mediastinal or hilar  adenopathy. No central pulmonary embolism, on this non-dedicated  study. Normal aortic caliber without dissection. Normal heart size,  without pericardial effusion. Soft tissue density in the anterior  mediastinum is likely residual thymus.  CT ABDOMEN AND PELVIS FINDINGS  Hepatobiliary: Normal liver and gallbladder, without biliary ductal  dilatation.  Pancreas: Normal, without mass or pancreatic ductal dilatation.  Spleen: Normal  Urinary tract: Normal adrenal glands. Normal kidneys, without  hydronephrosis or hydroureter. Normal bladder.  Stomach/Bowel: Normal stomach, without wall thickening. Normal  colon, appendix, and terminal ileum. Normal small bowel without  abdominal ascites. No evidence of omental or peritoneal disease.  Vascular/Lymphatic: No aneurysm. No abdominopelvic adenopathy.  Reproductive: Normal prostate. Left orchiectomy.  Other: No significant free fluid. Tiny fat containing right inguinal  hernia.  Musculoskeletal: Tiny bone islands in the femoral heads. No  worrisome osseous lesion. Mild disc bulge at L4-5. Loss of  intervertebral disc height at the lumbosacral junction.  IMPRESSION:  1. Status post left orchiectomy, without evidence of metastatic  disease.  2. Improved appearance of the lower lobe predominant interstitial  thickening described on the prior exam. This was likely infectious.   Impression and Plan:   33 year old gentleman with the following issues:   1. 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 He presented with a left testicular mass and underwent an orchiectomy which showed a mixed germ cell tumor. His embryonal component is about 30% without lymphovascular invasion. His clinical staging is T1 N0. His tumor markers showed an elevated alpha-fetoprotein of 874 prior to surgery and declined to 4.2 on 04/24/2014. CT scan and tumor marker from 09/04/2014 were discussed  today and showed no evidence of disease. I plan on continuing active surveillance and repeat imaging studies and tumor markers in 4 months. This schedule will be continued in his second year of surveillance.  2. Followup: In 4 months.   QKMMNO,TRRNH, MD 10/21/20151:29 PM

## 2015-01-15 ENCOUNTER — Ambulatory Visit (HOSPITAL_COMMUNITY)
Admission: RE | Admit: 2015-01-15 | Discharge: 2015-01-15 | Disposition: A | Discharge status: Home / Self Care | Payer: 59 | Source: Ambulatory Visit | Attending: Oncology | Admitting: Oncology

## 2015-01-15 ENCOUNTER — Encounter (HOSPITAL_COMMUNITY): Payer: Self-pay

## 2015-01-15 ENCOUNTER — Other Ambulatory Visit (HOSPITAL_BASED_OUTPATIENT_CLINIC_OR_DEPARTMENT_OTHER): Payer: 59

## 2015-01-15 DIAGNOSIS — C6292 Malignant neoplasm of left testis, unspecified whether descended or undescended: Secondary | ICD-10-CM | POA: Diagnosis present

## 2015-01-15 DIAGNOSIS — Z9079 Acquired absence of other genital organ(s): Secondary | ICD-10-CM | POA: Diagnosis not present

## 2015-01-15 DIAGNOSIS — C6290 Malignant neoplasm of unspecified testis, unspecified whether descended or undescended: Secondary | ICD-10-CM

## 2015-01-15 LAB — CBC WITH DIFFERENTIAL/PLATELET
BASO%: 0.9 % (ref 0.0–2.0)
BASOS ABS: 0.1 10*3/uL (ref 0.0–0.1)
EOS ABS: 0 10*3/uL (ref 0.0–0.5)
EOS%: 0.8 % (ref 0.0–7.0)
HEMATOCRIT: 41.7 % (ref 38.4–49.9)
HEMOGLOBIN: 13.6 g/dL (ref 13.0–17.1)
LYMPH%: 26.7 % (ref 14.0–49.0)
MCH: 29.3 pg (ref 27.2–33.4)
MCHC: 32.7 g/dL (ref 32.0–36.0)
MCV: 89.6 fL (ref 79.3–98.0)
MONO#: 0.4 10*3/uL (ref 0.1–0.9)
MONO%: 6.7 % (ref 0.0–14.0)
NEUT%: 64.9 % (ref 39.0–75.0)
NEUTROS ABS: 4 10*3/uL (ref 1.5–6.5)
PLATELETS: 166 10*3/uL (ref 140–400)
RBC: 4.66 10*6/uL (ref 4.20–5.82)
RDW: 12.8 % (ref 11.0–14.6)
WBC: 6.2 10*3/uL (ref 4.0–10.3)
lymph#: 1.7 10*3/uL (ref 0.9–3.3)

## 2015-01-15 LAB — COMPREHENSIVE METABOLIC PANEL (CC13)
ALBUMIN: 4.5 g/dL (ref 3.5–5.0)
ALT: 26 U/L (ref 0–55)
ANION GAP: 10 meq/L (ref 3–11)
AST: 23 U/L (ref 5–34)
Alkaline Phosphatase: 44 U/L (ref 40–150)
BUN: 20.8 mg/dL (ref 7.0–26.0)
CHLORIDE: 106 meq/L (ref 98–109)
CO2: 25 meq/L (ref 22–29)
Calcium: 9.7 mg/dL (ref 8.4–10.4)
Creatinine: 0.8 mg/dL (ref 0.7–1.3)
EGFR: >90 mL/min/{1.73_m2} (ref >90)
Glucose: 89 mg/dl (ref 70–140)
POTASSIUM: 4.3 meq/L (ref 3.5–5.1)
SODIUM: 140 meq/L (ref 136–145)
TOTAL PROTEIN: 7.6 g/dL (ref 6.4–8.3)
Total Bilirubin: 0.48 mg/dL (ref 0.20–1.20)

## 2015-01-15 LAB — LACTATE DEHYDROGENASE (CC13): LDH: 123 U/L — AB (ref 125–245)

## 2015-01-15 MED ORDER — IOHEXOL 300 MG/ML  SOLN
100.0000 mL | Freq: Once | INTRAMUSCULAR | Status: AC | PRN
  Administered 2015-01-15: 100 mL via INTRAVENOUS

## 2015-01-20 LAB — AFP TUMOR MARKER-PREVIOUS METHOD: AFP Tumor Marker: 3.1 ng/mL (ref 0.0–8.0)

## 2015-01-20 LAB — AFP TUMOR MARKER: AFP-Tumor Marker: 3.1 ng/mL (ref <6.1)

## 2015-01-20 LAB — BETA HCG QUANT (REF LAB): Beta hCG, Tumor Marker: <2 m[IU]/mL (ref <5.0)

## 2015-01-22 ENCOUNTER — Ambulatory Visit (HOSPITAL_BASED_OUTPATIENT_CLINIC_OR_DEPARTMENT_OTHER): Payer: 59 | Admitting: Oncology

## 2015-01-22 ENCOUNTER — Telehealth: Payer: Self-pay | Admitting: Oncology

## 2015-01-22 VITALS — BP 138/78 | HR 79 | Temp 97.7°F | Resp 18 | Ht 75.5 in | Wt 230.8 lb

## 2015-01-22 DIAGNOSIS — C6290 Malignant neoplasm of unspecified testis, unspecified whether descended or undescended: Secondary | ICD-10-CM

## 2015-01-22 DIAGNOSIS — C6292 Malignant neoplasm of left testis, unspecified whether descended or undescended: Secondary | ICD-10-CM

## 2015-01-22 NOTE — Progress Notes (Signed)
Hematology and Oncology Follow Up Visit  Eric Wilkinson [id number] 1981-08-02 34 y.o. 01/22/2015 1:56 PM No PCP Per PatientNo ref. 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 He was found to have a T1 N0 with 30% embryonal component45% yolk sac and 20% seminoma without lymphovascular invasion. Diagnosed in March of 2015.   Prior Therapy: On 01/25/2014 he underwent a left radical orchiectomy    Current therapy: Observation and surveillance.  Interim History:  Eric Wilkinson presents today for a followup visit. Since his last visit, he is doing very well at this time. He has not reported any new complaints since last visit. He did not report any inguinal masses or abnormal adenopathy. He continues to work full time and perform activities of daily living without any decline.  He does not report any headaches blurred vision double vision. Did not report any neurological symptoms of ulceration mental status, psychiatric issues or depression. He does not report any chest pain shortness of breath or cough or hemoptysis. Does not report any palpitation leg edema orthopnea. Does not report any nausea or vomiting abdominal pain. Has not reported any hematochezia or melena. Does not report any frequency urgency or hesitancy. Does not report any musculoskeletal complaints. Does not report any leg edema, lymphadenopathy or skin rashes. The rest of his review of systems unremarkable. 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    Medications: I have reviewed the patient's current medications.    Allergies:  Allergies  Allergen Reactions  . Amoxicillin Rash  . Erythromycin Rash    Past Medical History, Surgical history, Social history, and Family History were reviewed and updated.   Physical Exam: Blood pressure 138/78, pulse 79, temperature 97.7 F (36.5 C), temperature source Oral, resp.  rate 18, height 6' 3.5" (1.918 m), weight 230 lb 12.8 oz (104.69 kg), SpO2 100 %. ECOG: 0 General appearance: alert and cooperative Head: Normocephalic, without obvious abnormality Neck: no adenopathy Lymph nodes: Cervical, supraclavicular, and axillary nodes normal. Heart:regular rate and rhythm, S1, S2 normal, no murmur, click, rub or gallop Lung:chest clear, no wheezing, rales, normal symmetric air entry Abdomin: soft, non-tender, without masses or organomegaly EXT:no erythema, induration, or nodules   Lab Results: Lab Results  Component Value Date   WBC 6.2 01/15/2015   HGB 13.6 01/15/2015   HCT 41.7 01/15/2015   MCV 89.6 01/15/2015   PLT 166 01/15/2015     Chemistry      Component Value Date/Time   NA 140 01/15/2015 1218   K 4.3 01/15/2015 1218   CO2 25 01/15/2015 1218   BUN 20.8 01/15/2015 1218   CREATININE 0.8 01/15/2015 1218      Component Value Date/Time   CALCIUM 9.7 01/15/2015 1218   ALKPHOS 44 01/15/2015 1218   AST 23 01/15/2015 1218   ALT 26 01/15/2015 1218   BILITOT 0.48 01/15/2015 1218       CLINICAL DATA: Followup evaluation for a 34 year old male with history of testicular cancer diagnosed in March 2015 status post left-sided orchiectomy.  EXAM: CT CHEST, ABDOMEN, AND PELVIS WITH CONTRAST  TECHNIQUE: Multidetector CT imaging of the chest, abdomen and pelvis was performed following the standard protocol during bolus administration of intravenous contrast.  CONTRAST: 169mL OMNIPAQUE IOHEXOL 300 MG/ML SOLN  COMPARISON: CT of the chest, abdomen and pelvis 09/04/2014.  FINDINGS: CT CHEST FINDINGS  Mediastinum/Lymph Nodes: Heart size is normal. There is no significant  pericardial fluid, thickening or pericardial calcification. No pathologically enlarged mediastinal or hilar lymph nodes. Small amount of residual thymic tissue in the anterior mediastinum is unchanged and benign appearance. Esophagus is unremarkable in appearance. No  axillary lymphadenopathy.  Lungs/Pleura: No suspicious appearing pulmonary nodules or masses. No acute consolidative airspace disease. No pleural effusions. Multiple small subpleural blebs are noted in the apices of the lungs, presumably from mild paraseptal emphysema.  Musculoskeletal/Soft Tissues: There are no aggressive appearing lytic or blastic lesions noted in the visualized portions of the skeleton.  CT ABDOMEN AND PELVIS FINDINGS  Hepatobiliary: No cystic or solid hepatic lesions. No intra or extrahepatic biliary ductal dilatation. Gallbladder is normal in appearance.  Pancreas: Unremarkable.  Spleen: Unremarkable.  Adrenals/Urinary Tract: Bilateral kidneys and bilateral adrenal glands are normal in appearance. No hydroureteronephrosis. Urinary bladder is normal in appearance.  Stomach/Bowel: Normal appearance of the stomach. No pathologic dilatation of small bowel or colon. Normal appendix.  Vascular/Lymphatic: No significant atherosclerotic disease in the visualized abdominal or pelvic vasculature. No aneurysm or dissection. No lymphadenopathy noted in the abdomen or pelvis.  Reproductive: Postoperative changes of left orchiectomy. Prostate gland and seminal vesicles are normal in appearance.  Other: No significant volume of ascites. No pneumoperitoneum.  Musculoskeletal: There are no aggressive appearing lytic or blastic lesions noted in the visualized portions of the skeleton.  IMPRESSION: 1. Status post left orchiectomy. No findings to suggest metastatic disease to the chest, abdomen or pelvis. 2. Subpleural blebs in the apices of the lungs bilaterally, indicative of mild paraseptal emphysema. 3. No acute findings.   Impression and Plan:   34 year old gentleman with the following issues:   1. 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 He presented with a left testicular mass and underwent an orchiectomy which showed a mixed germ cell  tumor. His embryonal component is about 30% without lymphovascular invasion. His clinical staging is T1 N0.   His tumor markers showed an elevated alpha-fetoprotein of 874 prior to surgery and declined to 4.2 on 04/24/2014.   CT scan and tumor marker from  01/15/2015 were discussed today and showed no evidence of disease. I plan on continuing active surveillance and repeat imaging studies and tumor markers in 4 months. This schedule will be continued in his second year of surveillance.  2. Followup: In 4 months for a CT scan and laboratory testing.   Zola Button, MD 3/2/20161:56 PM

## 2015-01-22 NOTE — Telephone Encounter (Signed)
Gave avs & calendar for July. Also gave contrast for CT scan.

## 2015-03-17 IMAGING — CT CT ABD-PELV W/ CM
2 of 5 series · 14 of 46 positions shown, 18 images · IV contrast (OMNIPAQUE)
Comparison: 02/04/2014

CLINICAL DATA: Left orchiectomy for testicular cancer.

EXAM:
CT CHEST, ABDOMEN, AND PELVIS WITH CONTRAST
TECHNIQUE: Multidetector CT imaging of the chest, abdomen and pelvis was
performed following the standard protocol during bolus
administration of intravenous contrast.
CONTRAST:  100mL OMNIPAQUE IOHEXOL 300 MG/ML  SOLN

[Series 2: cap with st · axial · 0.79mm/px · z∈[-695,-75]mm · 11 of 145 slices shown, 15 images]
[im 14/145  soft-tissue]
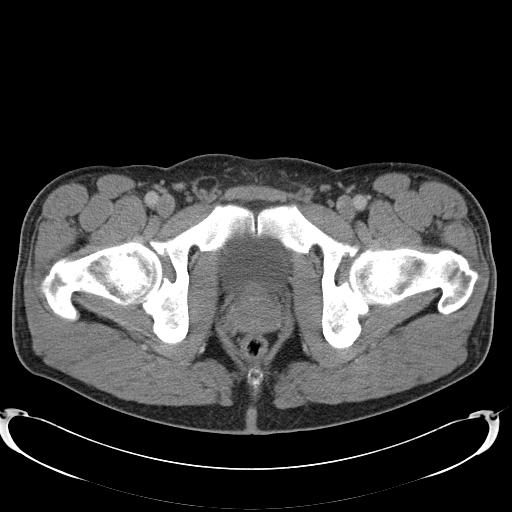
[im 14/145  bone]
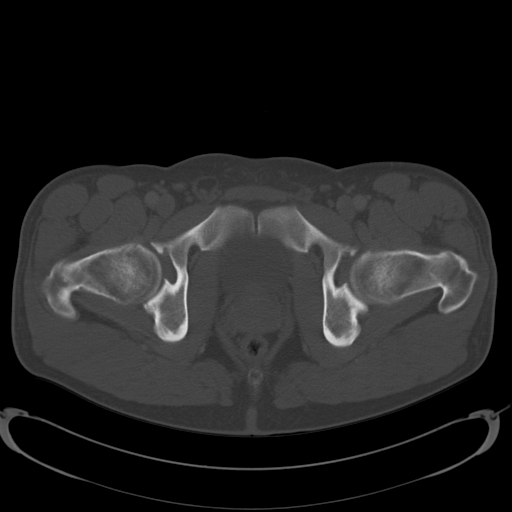
[im 28/145  soft-tissue]
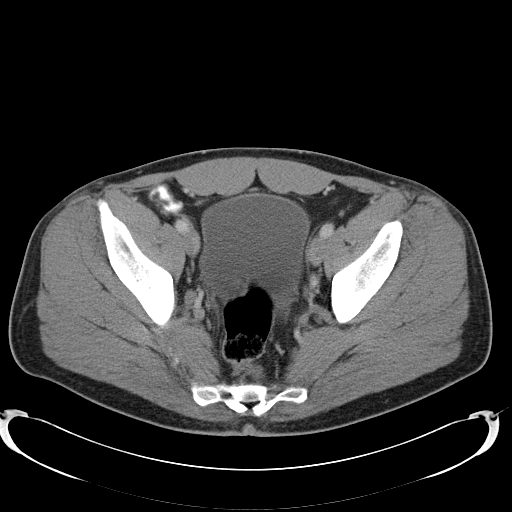
[im 42/145  soft-tissue]
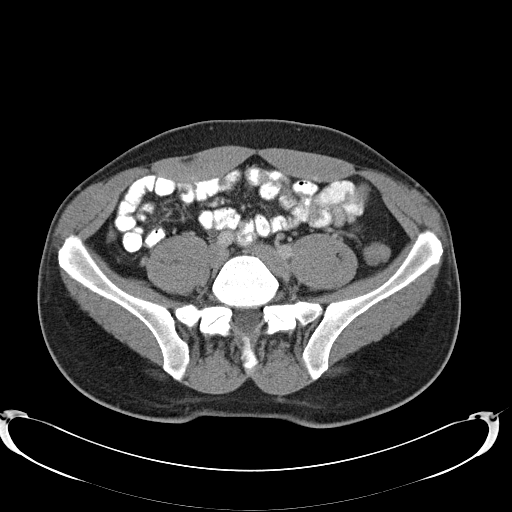
[im 55/145  soft-tissue]
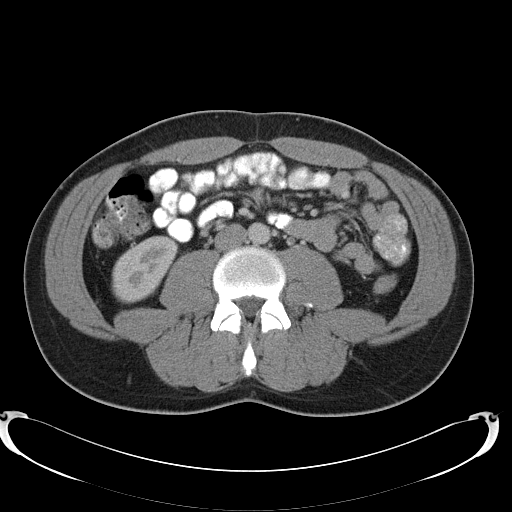
[im 76/145  soft-tissue]
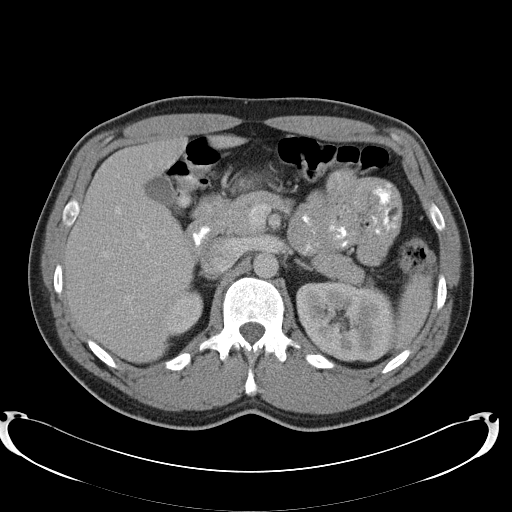
[im 90/145  soft-tissue]
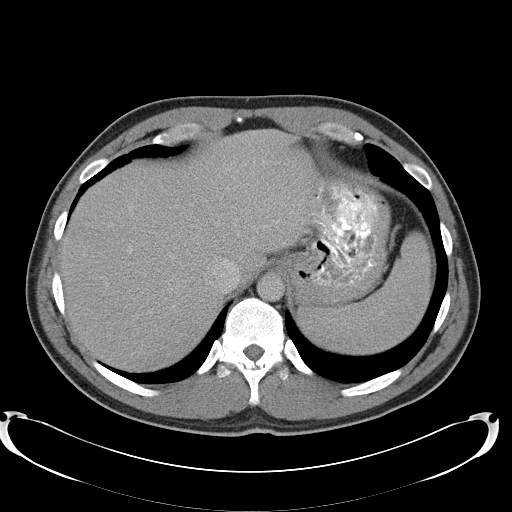
[im 103/145  soft-tissue]
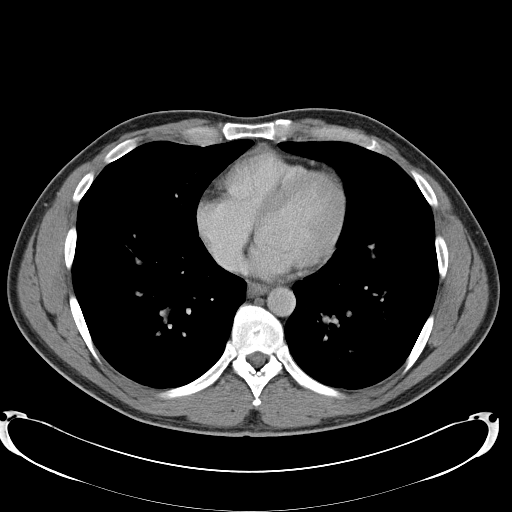
[im 117/145  soft-tissue]
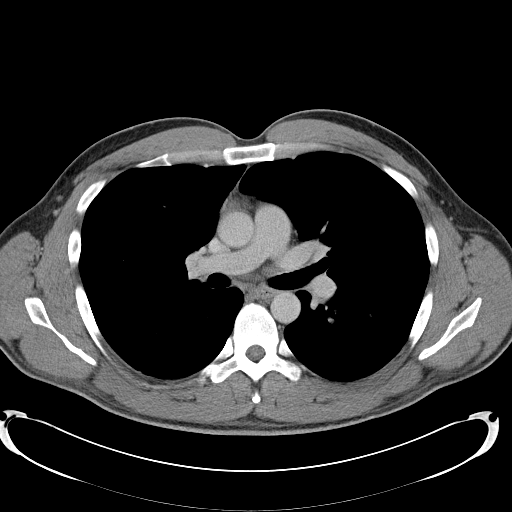
[im 117/145  lung]
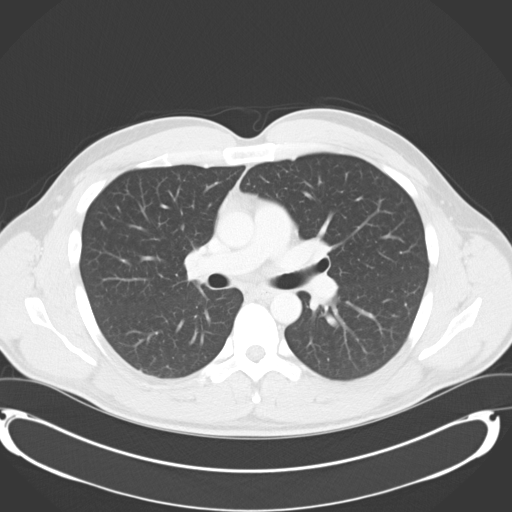
[im 124/145  lung]
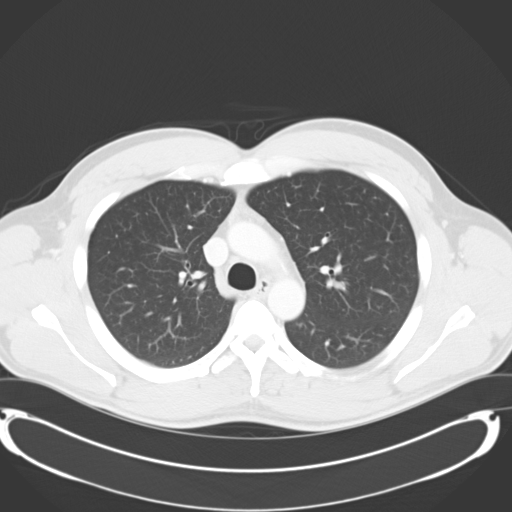
[im 131/145  soft-tissue]
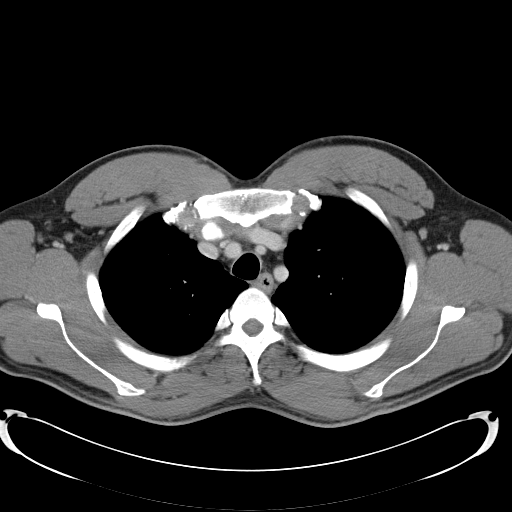
[im 131/145  lung]
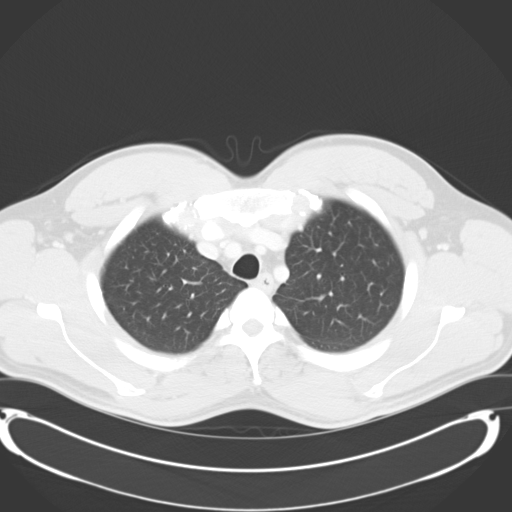
[im 131/145  bone]
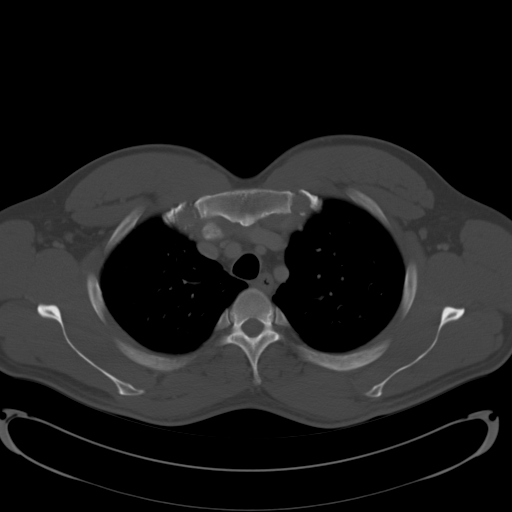
[im 138/145  lung]
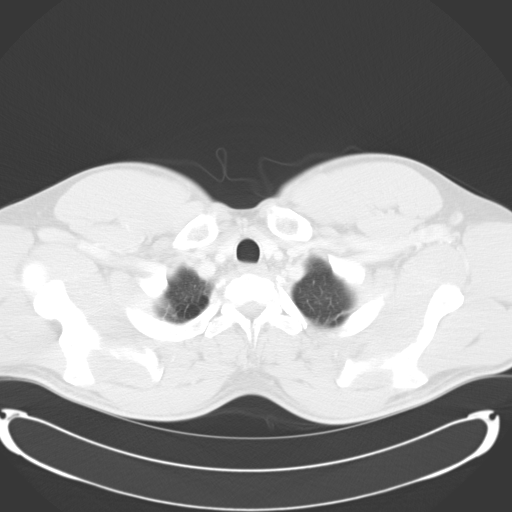

[Series 602: <mpr thick range> · coronal · 1.42mm/px · 3 of 86 slices shown]
[im 29/86  soft-tissue]
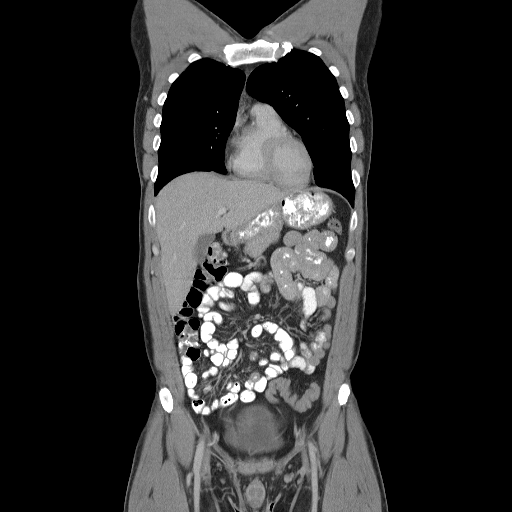
[im 38/86  soft-tissue]
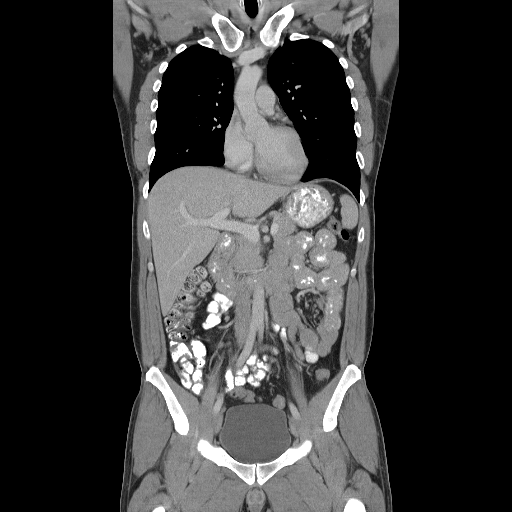
[im 48/86  soft-tissue]
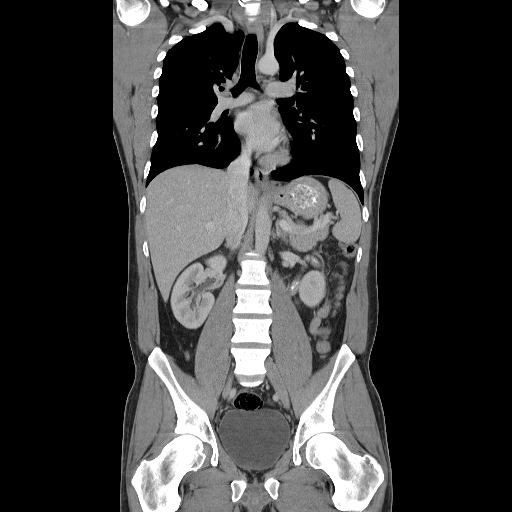

[14 of 46 positions shown; findings below may reference images not displayed]

FINDINGS: CT CHEST FINDINGS

Lungs/Pleura: Minimal paraseptal emphysema.

Mild lower lobe predominant interstitial thickening is felt to be
similar to slightly increased. No nodules or masses.

No pleural fluid.

Heart/Mediastinum: No supraclavicular adenopathy. Normal heart size,
without pericardial effusion. There is trace anterior pericardial
thickening which is unchanged. No central pulmonary embolism, on
this non-dedicated study. Similar small high left mediastinal node,
not pathologic by size criteria. Stable small middle mediastinal
nodes, without mediastinal or hilar adenopathy.

Soft tissue density in the anterior mediastinum is likely residual
thymus.

CT ABDOMEN AND PELVIS FINDINGS

Abdomen/Pelvis: Similar mild prominence of the right lobe of the
liver, without focal liver lesion.

Normal spleen, stomach, pancreas, gallbladder, biliary tract,
adrenal glands, kidneys.

No retroperitoneal or retrocrural adenopathy.

Normal colon, appendix, and terminal ileum. Normal small bowel
without abdominal ascites. Tiny fat containing right inguinal
hernia. No pelvic adenopathy. Left orchiectomy.

Bones/Musculoskeletal: No acute osseous abnormality.
IMPRESSION: .

1. Status post right orchiectomy, without evidence of acute process
or metastatic disease within the chest, abdomen, or pelvis.
2. Mild lower lobe predominant interstitial thickening. Favored to
be the sequelae of prior smoking/chronic bronchitis. This is felt to
be similar versus minimally increased. Correlate with symptoms to
suggest acute infection.

## 2015-05-22 ENCOUNTER — Telehealth: Payer: Self-pay | Admitting: Oncology

## 2015-05-22 NOTE — Telephone Encounter (Signed)
Due to PAL moved 7/8 f/u to 7/7. Appointment for lab 7/6 remains the same. Contacted Kenisha at [facility] alerted her that patient ct has not been scheduled and asking that someone contact patient re ct. Left message for patient re new date/time for f/u 7/6 and confirmed lab for 7/6. Also gave number for [facility] and asked that patient contact them re ct to be scheduled prior to seeing FS 7/7. Schedule mailed.

## 2015-05-28 ENCOUNTER — Other Ambulatory Visit: Payer: 59

## 2015-05-28 ENCOUNTER — Ambulatory Visit (HOSPITAL_COMMUNITY): Payer: BLUE CROSS/BLUE SHIELD

## 2015-05-29 ENCOUNTER — Ambulatory Visit: Payer: 59 | Admitting: Oncology

## 2015-05-30 ENCOUNTER — Ambulatory Visit: Payer: 59 | Admitting: Oncology

## 2015-06-26 ENCOUNTER — Ambulatory Visit (HOSPITAL_COMMUNITY)
Admission: RE | Admit: 2015-06-26 | Discharge: 2015-06-26 | Disposition: A | Discharge status: Home / Self Care | Payer: BLUE CROSS/BLUE SHIELD | Source: Ambulatory Visit | Attending: Oncology | Admitting: Oncology

## 2015-06-26 ENCOUNTER — Encounter (HOSPITAL_COMMUNITY): Payer: Self-pay

## 2015-06-26 ENCOUNTER — Other Ambulatory Visit (HOSPITAL_BASED_OUTPATIENT_CLINIC_OR_DEPARTMENT_OTHER): Payer: BLUE CROSS/BLUE SHIELD

## 2015-06-26 DIAGNOSIS — C6292 Malignant neoplasm of left testis, unspecified whether descended or undescended: Secondary | ICD-10-CM | POA: Diagnosis present

## 2015-06-26 DIAGNOSIS — J439 Emphysema, unspecified: Secondary | ICD-10-CM | POA: Insufficient documentation

## 2015-06-26 DIAGNOSIS — R918 Other nonspecific abnormal finding of lung field: Secondary | ICD-10-CM | POA: Diagnosis not present

## 2015-06-26 DIAGNOSIS — C6290 Malignant neoplasm of unspecified testis, unspecified whether descended or undescended: Secondary | ICD-10-CM

## 2015-06-26 DIAGNOSIS — Z9079 Acquired absence of other genital organ(s): Secondary | ICD-10-CM | POA: Insufficient documentation

## 2015-06-26 LAB — CBC WITH DIFFERENTIAL/PLATELET
BASO%: 0.2 % (ref 0.0–2.0)
Basophils Absolute: 0 10*3/uL (ref 0.0–0.1)
EOS%: 3.4 % (ref 0.0–7.0)
Eosinophils Absolute: 0.2 10*3/uL (ref 0.0–0.5)
HCT: 38.8 % (ref 38.4–49.9)
HGB: 13.3 g/dL (ref 13.0–17.1)
LYMPH%: 27.3 % (ref 14.0–49.0)
MCH: 30.8 pg (ref 27.2–33.4)
MCHC: 34.3 g/dL (ref 32.0–36.0)
MCV: 89.8 fL (ref 79.3–98.0)
MONO#: 0.4 10*3/uL (ref 0.1–0.9)
MONO%: 8.9 % (ref 0.0–14.0)
NEUT%: 60.2 % (ref 39.0–75.0)
NEUTROS ABS: 2.8 10*3/uL (ref 1.5–6.5)
PLATELETS: 143 10*3/uL (ref 140–400)
RBC: 4.32 10*6/uL (ref 4.20–5.82)
RDW: 12.5 % (ref 11.0–14.6)
WBC: 4.7 10*3/uL (ref 4.0–10.3)
lymph#: 1.3 10*3/uL (ref 0.9–3.3)

## 2015-06-26 LAB — COMPREHENSIVE METABOLIC PANEL (CC13)
ALBUMIN: 4.3 g/dL (ref 3.5–5.0)
ALT: 24 U/L (ref 0–55)
AST: 21 U/L (ref 5–34)
Alkaline Phosphatase: 44 U/L (ref 40–150)
Anion Gap: 7 mEq/L (ref 3–11)
BILIRUBIN TOTAL: 0.56 mg/dL (ref 0.20–1.20)
BUN: 12.9 mg/dL (ref 7.0–26.0)
CHLORIDE: 107 meq/L (ref 98–109)
CO2: 27 meq/L (ref 22–29)
Calcium: 9.3 mg/dL (ref 8.4–10.4)
Creatinine: 0.9 mg/dL (ref 0.7–1.3)
Glucose: 91 mg/dl (ref 70–140)
POTASSIUM: 4.3 meq/L (ref 3.5–5.1)
Sodium: 141 mEq/L (ref 136–145)
TOTAL PROTEIN: 7.2 g/dL (ref 6.4–8.3)

## 2015-06-26 LAB — LACTATE DEHYDROGENASE (CC13): LDH: 123 U/L — AB (ref 125–245)

## 2015-06-26 MED ORDER — IOHEXOL 300 MG/ML  SOLN
100.0000 mL | Freq: Once | INTRAMUSCULAR | Status: AC | PRN
  Administered 2015-06-26: 100 mL via INTRAVENOUS

## 2015-06-29 LAB — AFP TUMOR MARKER: AFP TUMOR MARKER: 3.1 ng/mL (ref <6.1)

## 2015-06-29 LAB — BETA HCG QUANT (REF LAB): Beta hCG, Tumor Marker: <2 m[IU]/mL (ref <5.0)

## 2015-07-03 ENCOUNTER — Telehealth: Payer: Self-pay | Admitting: Oncology

## 2015-07-03 ENCOUNTER — Ambulatory Visit (HOSPITAL_BASED_OUTPATIENT_CLINIC_OR_DEPARTMENT_OTHER): Payer: BLUE CROSS/BLUE SHIELD | Admitting: Oncology

## 2015-07-03 VITALS — BP 139/71 | HR 79 | Temp 97.9°F | Resp 18 | Ht 75.5 in | Wt 218.8 lb

## 2015-07-03 DIAGNOSIS — C6290 Malignant neoplasm of unspecified testis, unspecified whether descended or undescended: Secondary | ICD-10-CM

## 2015-07-03 DIAGNOSIS — Z8547 Personal history of malignant neoplasm of testis: Secondary | ICD-10-CM | POA: Diagnosis not present

## 2015-07-03 NOTE — Telephone Encounter (Signed)
Pt confirmed labs/ov per 08/11 POF, gave pt avs and calendar... KJ, gave pt barium and instructions

## 2015-07-03 NOTE — Progress Notes (Signed)
Hematology and Oncology Follow Up Visit  Eric Wilkinson [id number] 1981-02-27 34 y.o. 07/03/2015 12:23 PM No PCP Per PatientNo ref. 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 He was found to have a T1 N0 with 30% embryonal component45% yolk sac and 20% seminoma without lymphovascular invasion. Diagnosed in March of 2015.   Prior Therapy: He is status post left radical orchiectomy on 01/25/2014. He declined adjuvant intervention after that.   Current therapy: Observation and surveillance.  Interim History:  Eric Wilkinson presents today for a followup visit by himself. Since his last visit, he reports no complaints. He does have some occasional shoulder pain associated with weight lifting but no injuries or trauma. He did not report any inguinal masses or abnormal adenopathy. He continues to work full time and perform activities of daily living without any decline. He has not reported any testicular masses or penile discharge. He denied any recent pneumonias or infection. He did not report any symptoms of bronchitis. He does not report any headaches blurred vision double vision. Did not report any neurological symptoms of ulceration mental status, psychiatric issues or depression. He does not report any chest pain shortness of breath or cough or hemoptysis. Does not report any palpitation leg edema orthopnea. Does not report any nausea or vomiting abdominal pain. Has not reported any hematochezia or melena. Does not report any frequency urgency or hesitancy. Does not report any musculoskeletal complaints. Does not report any leg edema, lymphadenopathy or skin rashes. The rest of his review of systems unremarkable.  Medications: I have reviewed the patient's current medications.    Allergies:  Allergies  Allergen Reactions  . Amoxicillin Rash  . Erythromycin Rash    Past Medical History, Surgical history, Social history, and Family  History were reviewed and updated.   Physical Exam: Blood pressure 139/71, pulse 79, temperature 97.9 F (36.6 C), temperature source Oral, resp. rate 18, height 6' 3.5" (1.918 m), weight 218 lb 12.8 oz (99.247 kg), SpO2 100 %. ECOG: 0 General appearance: alert and cooperative healthy appearing gentleman not in any distress. Head: Normocephalic, without obvious abnormality Neck: no adenopathy Lymph nodes: Cervical, supraclavicular, and axillary nodes normal. Heart:regular rate and rhythm, S1, S2 normal, no murmur, click, rub or gallop Lung:chest clear, no wheezing, rales, normal symmetric air entry Abdomin: soft, non-tender, without masses or organomegaly EXT:no erythema, induration, or nodules Neurological: No deficits.  Lab Results: Lab Results  Component Value Date   WBC 4.7 06/26/2015   HGB 13.3 06/26/2015   HCT 38.8 06/26/2015   MCV 89.8 06/26/2015   PLT 143 06/26/2015     Chemistry      Component Value Date/Time   NA 141 06/26/2015 0844   K 4.3 06/26/2015 0844   CO2 27 06/26/2015 0844   BUN 12.9 06/26/2015 0844   CREATININE 0.9 06/26/2015 0844      Component Value Date/Time   CALCIUM 9.3 06/26/2015 0844   ALKPHOS 44 06/26/2015 0844   AST 21 06/26/2015 0844   ALT 24 06/26/2015 0844   BILITOT 0.56 06/26/2015 0844     Results for Eric Wilkinson, Eric Wilkinson (MRN [id number]) as of 07/03/2015 12:09  Ref. Range 06/26/2015 08:44  AFP Tumor Marker Latest Ref Range: <6.1 ng/mL 3.1  Beta hCG, Tumor Marker Latest Ref Range: < 5.0 mIU/mL < 2.0    EXAM: CT CHEST, ABDOMEN, AND PELVIS WITH CONTRAST  TECHNIQUE: Multidetector CT imaging of the chest, abdomen and pelvis was performed following the standard  protocol during bolus administration of intravenous contrast.  CONTRAST: 172mL OMNIPAQUE IOHEXOL 300 MG/ML SOLN  COMPARISON: Most recent CT chest, abdomen and pelvis from 01/15/2015.  FINDINGS: CT CHEST FINDINGS  Mediastinum/Nodes: Normal heart size. No  pericardial fluid/thickening. Great vessels are normal in course and caliber. No central pulmonary emboli. Normal visualized thyroid. Normal esophagus. No axillary, mediastinal or hilar lymphadenopathy. There is stable minimal remnant thymic tissue with internal fatty degeneration in the anterior mediastinum.  Lungs/Pleura: No pneumothorax. No pleural effusion. Stable mild biapical upper lobe paraseptal emphysema. There are adjacent nonspecific ground-glass 1.7 x 0.8 cm and 1.8 x 1.0 cm nodular opacities in the right lower lobe (series 4/images 49 and 51), which were not definitely present on the prior chest CT. Otherwise clear lungs.  Musculoskeletal: Minimal degenerative changes in the thoracic spine. No suspicious focal osseous lesion in the chest.  CT ABDOMEN AND PELVIS FINDINGS  Hepatobiliary: Normal liver, with no liver mass. Normal gallbladder. No biliary ductal dilatation.  Pancreas: Normal.  Spleen: Normal.  Adrenals/Urinary Tract: Normal adrenals. Normal kidneys with no hydronephrosis and no renal mass. Relatively collapsed and grossly normal bladder.  Stomach/Bowel: Collapsed and grossly normal stomach. Normal caliber small bowel, with no small bowel wall thickening. Normal appendix. Normal large bowel.  Vascular/Lymphatic: Normal caliber abdominal aorta. No lymphadenopathy in the abdomen or pelvis.  Reproductive: Normal size prostate.  Other: No pneumoperitoneum, ascites or focal fluid collection.  Musculoskeletal: No suspicious focal osseous lesions in the abdomen or pelvis. Tiny subcentimeter sclerotic foci of the bilateral pelvic girdle are unchanged since 02/04/2014 and likely represent benign bone islands. Stable postsurgical changes from left orchiectomy.  IMPRESSION: 1. Two new adjacent nonspecific ground-glass nodular opacities in the right lower lobe, likely inflammatory. Recommend follow-up chest CT in 3 months. This recommendation  follows the consensus statement: Recommendations for the Management of Subsolid Pulmonary Nodules Detected at CT: A Statement from the [place]. Radiology [id number]. 2. Stable mild paraseptal emphysema in the apical upper lobes. Otherwise normal chest CT. 3. No evidence of metastatic disease in the abdomen or pelvis.   Impression and Plan:   34 year old gentleman with the following issues:   1. 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 He presented with a left testicular mass and underwent an orchiectomy which showed a mixed germ cell tumor. His embryonal component is about 30% without lymphovascular invasion. His clinical staging is T1 N0.   His tumor markers showed an elevated alpha-fetoprotein of 874 prior to surgery and declined to 4.2 on 04/24/2014.   He is currently on active surveillance with laboratory testing and CT scans. His labs from 06/26/2015 were reviewed and showed no evidence of any elevation in his tumor markers. CT scan of the chest showed 2 new nonspecific groundglass nodular opacities likely inflammatory in nature. No clear-cut malignancy noted. Otherwise his scans are all within normal range.  The plan is to repeat tumor markers, physical examination and CT scan in January 2017. After that he will need imaging studies every 6 months for the next 2 years and annually after that.  2. Pulmonary nodules: Likely inflammatory in nature but need to be followed. CT scan will be repeated in January 2016.  3. Follow-up: Will be in January 2017 after laboratory testing and imaging studies.   [facility], MD 8/11/201612:23 PM

## 2015-12-25 ENCOUNTER — Other Ambulatory Visit: Payer: BLUE CROSS/BLUE SHIELD

## 2016-01-01 ENCOUNTER — Ambulatory Visit: Payer: BLUE CROSS/BLUE SHIELD | Admitting: Oncology

## 2016-09-17 ENCOUNTER — Other Ambulatory Visit: Payer: Self-pay | Admitting: Oncology

## 2016-09-17 DIAGNOSIS — C6292 Malignant neoplasm of left testis, unspecified whether descended or undescended: Secondary | ICD-10-CM

## 2016-10-27 ENCOUNTER — Ambulatory Visit (HOSPITAL_BASED_OUTPATIENT_CLINIC_OR_DEPARTMENT_OTHER): Payer: BLUE CROSS/BLUE SHIELD | Admitting: Oncology

## 2016-10-27 ENCOUNTER — Telehealth: Payer: Self-pay | Admitting: Oncology

## 2016-10-27 ENCOUNTER — Other Ambulatory Visit (HOSPITAL_BASED_OUTPATIENT_CLINIC_OR_DEPARTMENT_OTHER): Payer: BLUE CROSS/BLUE SHIELD

## 2016-10-27 VITALS — BP 122/68 | HR 86 | Temp 98.1°F | Resp 16 | Ht 75.5 in | Wt 226.4 lb

## 2016-10-27 DIAGNOSIS — C6292 Malignant neoplasm of left testis, unspecified whether descended or undescended: Secondary | ICD-10-CM

## 2016-10-27 DIAGNOSIS — Z8547 Personal history of malignant neoplasm of testis: Secondary | ICD-10-CM

## 2016-10-27 LAB — COMPREHENSIVE METABOLIC PANEL
ALBUMIN: 4.1 g/dL (ref 3.5–5.0)
ALK PHOS: 45 U/L (ref 40–150)
ALT: 21 U/L (ref 0–55)
ANION GAP: 10 meq/L (ref 3–11)
AST: 21 U/L (ref 5–34)
BILIRUBIN TOTAL: 0.36 mg/dL (ref 0.20–1.20)
BUN: 13.2 mg/dL (ref 7.0–26.0)
CALCIUM: 9.6 mg/dL (ref 8.4–10.4)
CO2: 25 meq/L (ref 22–29)
Chloride: 104 mEq/L (ref 98–109)
Creatinine: 0.8 mg/dL (ref 0.7–1.3)
Glucose: 91 mg/dl (ref 70–140)
Potassium: 4.1 mEq/L (ref 3.5–5.1)
Sodium: 139 mEq/L (ref 136–145)
TOTAL PROTEIN: 7.8 g/dL (ref 6.4–8.3)

## 2016-10-27 LAB — CBC WITH DIFFERENTIAL/PLATELET
BASO%: 0.2 % (ref 0.0–2.0)
Basophils Absolute: 0 10*3/uL (ref 0.0–0.1)
EOS ABS: 0.1 10*3/uL (ref 0.0–0.5)
EOS%: 1 % (ref 0.0–7.0)
HCT: 40.3 % (ref 38.4–49.9)
HGB: 13.9 g/dL (ref 13.0–17.1)
LYMPH%: 15.4 % (ref 14.0–49.0)
MCH: 31 pg (ref 27.2–33.4)
MCHC: 34.5 g/dL (ref 32.0–36.0)
MCV: 89.8 fL (ref 79.3–98.0)
MONO#: 0.9 10*3/uL (ref 0.1–0.9)
MONO%: 8.9 % (ref 0.0–14.0)
NEUT%: 74.5 % (ref 39.0–75.0)
NEUTROS ABS: 7.6 10*3/uL — AB (ref 1.5–6.5)
Platelets: 152 10*3/uL (ref 140–400)
RBC: 4.49 10*6/uL (ref 4.20–5.82)
RDW: 12.1 % (ref 11.0–14.6)
WBC: 10.2 10*3/uL (ref 4.0–10.3)
lymph#: 1.6 10*3/uL (ref 0.9–3.3)

## 2016-10-27 LAB — LACTATE DEHYDROGENASE: LDH: 117 U/L — AB (ref 125–245)

## 2016-10-27 NOTE — Progress Notes (Signed)
Hematology and Oncology Follow Up Visit  Eric Wilkinson [id number] 12-Nov-1981 35 y.o. 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 He was found to have a T1 N0 with 30% embryonal component45% yolk sac and 20% seminoma without lymphovascular invasion. Diagnosed in March of 2015.   Prior Therapy: He is status post left radical orchiectomy on 01/25/2014. He declined adjuvant therapy and opted for active surveillance.   Current therapy: Observation and surveillance.  Interim History:  Eric Wilkinson presents today for a followup visit. Since his last visit, he has been getting his routine active surveillance at [facility] urology. He reports no complaints. He did not report any inguinal masses or abnormal adenopathy. He continues to work full time and perform activities of daily living without any decline. He has not reported any testicular masses or penile discharge. He denied any recent pneumonias or infection. He is recovering from an upper respiratory tract infection.   He does not report any headaches blurred vision double vision. He does not report any chest pain shortness of breath or cough or hemoptysis. Does not report any palpitation leg edema orthopnea. Does not report any nausea or vomiting abdominal pain. Has not reported any hematochezia or melena. Does not report any frequency urgency or hesitancy. Does not report any musculoskeletal complaints. Does not report any leg edema, lymphadenopathy or skin rashes. The rest of his review of systems unremarkable.  Medications: I have reviewed the patient's current medications.    Allergies:  Allergies  Allergen Reactions  . Amoxicillin Rash  . Erythromycin Rash    Past Medical History, Surgical history, Social history, and Family History were reviewed and updated.   Physical Exam: Blood pressure 122/68, pulse 86, temperature 98.1 F  (36.7 C), temperature source Oral, resp. rate 16, height 6' 3.5" (1.918 m), weight 226 lb 6.4 oz (102.7 kg), SpO2 100 %. ECOG: 0 General appearance: Well-appearing gentleman without distress. Head: Normocephalic, without obvious abnormality no oral ulcers or lesions. Neck: no adenopathy Lymph nodes: Cervical, supraclavicular, and axillary nodes normal. Heart:regular rate and rhythm, S1, S2 normal, no murmur, click, rub or gallop Lung:chest clear, no wheezing, rales, normal symmetric air entry Abdomin: soft, non-tender, without masses or organomegaly no splenomegaly. EXT:no erythema, induration, or nodules Neurological: No deficits.  Lab Results: Lab Results  Component Value Date   WBC 10.2 10/27/2016   HGB 13.9 10/27/2016   HCT 40.3 10/27/2016   MCV 89.8 10/27/2016   PLT 152 10/27/2016     Chemistry      Component Value Date/Time   NA 141 06/26/2015 0844   K 4.3 06/26/2015 0844   CO2 27 06/26/2015 0844   BUN 12.9 06/26/2015 0844   CREATININE 0.9 06/26/2015 0844      Component Value Date/Time   CALCIUM 9.3 06/26/2015 0844   ALKPHOS 44 06/26/2015 0844   AST 21 06/26/2015 0844   ALT 24 06/26/2015 0844   BILITOT 0.56 06/26/2015 8425     35 year old gentleman with the following issues:   1. 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 He presented with a left testicular mass and underwent an orchiectomy which showed a mixed germ cell tumor. His embryonal component is about 30% without lymphovascular invasion. His clinical staging is T1 N0.   His tumor markers showed an elevated alpha-fetoprotein of 874 prior to surgery and declined to 4.2 on 04/24/2014.   He is currently on active surveillance. His most recent  CT scan obtained in October 2017 showed no evidence of recurrent disease. His repeat tumor markers are currently pending. He continues to show no evidence of disease, I recommended continued observation and surveillance in year 4 and year 5 with less frequency. Imaging  studies can be done annually with clinical visits and tumor markers every 6 months.  He is currently receiving surveillance at [facility] urology which I recommended he continues to do.  2. Pulmonary nodules: Likely inflammatory in nature but need to be followed. CT scan in October 2017 showed stability likely benign etiology.  3. Follow-up: Will be in 6 months to check on his progress.   Zola Button, MD 12/6/20174:12 PM

## 2016-10-27 NOTE — Telephone Encounter (Signed)
Appointments scheduled per 12/6 LOS. Patient given AVS report and calendars with future scheduled appointments. °

## 2016-10-28 LAB — ALPHA FETO PROTEIN (PARALLEL TESTING): AFP-Tumor Marker: 3.2 ng/mL (ref <6.1)

## 2016-10-28 LAB — AFP TUMOR MARKER: AFP, Serum, Tumor Marker: 3.3 ng/mL (ref 0.0–8.3)

## 2016-10-28 LAB — BETA HCG QUANT (REF LAB): hCG Quant: <1 m[IU]/mL (ref 0–3)

## 2016-11-04 ENCOUNTER — Telehealth: Payer: Self-pay | Admitting: Oncology

## 2016-11-04 NOTE — Telephone Encounter (Signed)
Faxed recent office note to [facility] (FP:[id number] release id)

## 2016-11-30 ENCOUNTER — Ambulatory Visit: Payer: BLUE CROSS/BLUE SHIELD | Admitting: Oncology

## 2016-12-29 ENCOUNTER — Ambulatory Visit: Payer: BLUE CROSS/BLUE SHIELD | Admitting: Oncology

## 2017-04-27 ENCOUNTER — Ambulatory Visit: Payer: BLUE CROSS/BLUE SHIELD | Admitting: Oncology

## 2022-04-02 ENCOUNTER — Inpatient Hospital Stay: Payer: BLUE CROSS/BLUE SHIELD

## 2022-04-02 ENCOUNTER — Other Ambulatory Visit: Payer: Self-pay

## 2022-04-02 ENCOUNTER — Inpatient Hospital Stay: Payer: BLUE CROSS/BLUE SHIELD | Attending: Oncology | Admitting: Oncology

## 2022-04-02 VITALS — BP 132/76 | HR 69 | Temp 97.8°F | Resp 16 | Ht 75.5 in | Wt 229.7 lb

## 2022-04-02 DIAGNOSIS — C6292 Malignant neoplasm of left testis, unspecified whether descended or undescended: Secondary | ICD-10-CM

## 2022-04-02 DIAGNOSIS — Z881 Allergy status to other antibiotic agents status: Secondary | ICD-10-CM | POA: Insufficient documentation

## 2022-04-02 DIAGNOSIS — Z79899 Other long term (current) drug therapy: Secondary | ICD-10-CM | POA: Insufficient documentation

## 2022-04-02 DIAGNOSIS — C6290 Malignant neoplasm of unspecified testis, unspecified whether descended or undescended: Secondary | ICD-10-CM | POA: Insufficient documentation

## 2022-04-02 DIAGNOSIS — Z880 Allergy status to penicillin: Secondary | ICD-10-CM | POA: Insufficient documentation

## 2022-04-02 LAB — CBC WITH DIFFERENTIAL (CANCER CENTER ONLY)
Abs Immature Granulocytes: 0.01 10*3/uL (ref 0.00–0.07)
Basophils Absolute: 0 10*3/uL (ref 0.0–0.1)
Basophils Relative: 1 %
Eosinophils Absolute: 0.1 10*3/uL (ref 0.0–0.5)
Eosinophils Relative: 1 %
HCT: 43 % (ref 39.0–52.0)
Hemoglobin: 15 g/dL (ref 13.0–17.0)
Immature Granulocytes: 0 %
Lymphocytes Relative: 20 %
Lymphs Abs: 1.3 10*3/uL (ref 0.7–4.0)
MCH: 30.9 pg (ref 26.0–34.0)
MCHC: 34.9 g/dL (ref 30.0–36.0)
MCV: 88.5 fL (ref 80.0–100.0)
Monocytes Absolute: 0.6 10*3/uL (ref 0.1–1.0)
Monocytes Relative: 10 %
Neutro Abs: 4.4 10*3/uL (ref 1.7–7.7)
Neutrophils Relative %: 68 %
Platelet Count: 197 10*3/uL (ref 150–400)
RBC: 4.86 MIL/uL (ref 4.22–5.81)
RDW: 12.1 % (ref 11.5–15.5)
WBC Count: 6.5 10*3/uL (ref 4.0–10.5)
nRBC: 0 % (ref 0.0–0.2)

## 2022-04-02 LAB — CMP (CANCER CENTER ONLY)
ALT: 19 U/L (ref 0–44)
AST: 26 U/L (ref 15–41)
Albumin: 4.4 g/dL (ref 3.5–5.0)
Alkaline Phosphatase: 28 U/L — ABNORMAL LOW (ref 38–126)
Anion gap: 4 — ABNORMAL LOW (ref 5–15)
BUN: 17 mg/dL (ref 6–20)
CO2: 27 mmol/L (ref 22–32)
Calcium: 9.3 mg/dL (ref 8.9–10.3)
Chloride: 106 mmol/L (ref 98–111)
Creatinine: 0.87 mg/dL (ref 0.61–1.24)
GFR, Estimated: >60 mL/min (ref >60)
Glucose, Bld: 100 mg/dL — ABNORMAL HIGH (ref 70–99)
Potassium: 4.4 mmol/L (ref 3.5–5.1)
Sodium: 137 mmol/L (ref 135–145)
Total Bilirubin: 0.7 mg/dL (ref 0.3–1.2)
Total Protein: 7.2 g/dL (ref 6.5–8.1)

## 2022-04-02 LAB — LACTATE DEHYDROGENASE: LDH: 102 U/L (ref 98–192)

## 2022-04-02 NOTE — Progress Notes (Signed)
Hematology and Oncology Follow Up Visit ? ?Eric Wilkinson ?[id number] ?1981/06/02 41 y.o. ?04/02/2022 9:52 AM ?Patient, No Pcp Per (Inactive)No ref. provider found  ? ?Principle Diagnosis: 41 year old man with T1N0 nonseminomatous testis cancer diagnosed in 2015.  He was found to have 30% embryonal component, 45% yolk sac and 20% seminoma without lymphovascular invasion.  ? ? ?Prior Therapy: He is status post left radical orchiectomy on 01/25/2014. He declined adjuvant therapy and opted for active surveillance. ? ? ?Current therapy: Active surveillance ? ?Interim History:  [name] returns today for a follow-up visit.  Since last visit, he reports feeling well without any major complaints.  He denies any signs or symptoms to suggest cancer recurrence at this time.  He continues to be on testosterone replacement therapy.  He remains active and works out regularly.  He denies any weight loss, appetite changes or pelvic pain. ? ?Medications: Updated on review ? ? ?Allergies:  ?Allergies  ?Allergen Reactions  ? Amoxicillin Rash  ? Erythromycin Rash  ? ? ? ? ? ?Physical Exam: ?Blood pressure 132/76, pulse 69, temperature 97.8 ?F (36.6 ?C), temperature source Temporal, resp. rate 16, height 6' 3.5" (1.918 m), weight 229 lb 11.2 oz (104.2 kg), SpO2 98 %. ? ?ECOG: 0 ? ? ? ?General appearance: Comfortable appearing without any discomfort ?Head: Normocephalic without any trauma ?Oropharynx: Mucous membranes are moist and pink without any thrush or ulcers. ?Eyes: Pupils are equal and round reactive to light. ?Lymph nodes: No cervical, supraclavicular, inguinal or axillary lymphadenopathy.   ?Heart:regular rate and rhythm.  S1 and S2 without leg edema. ?Lung: Clear without any rhonchi or wheezes.  No dullness to percussion. ?Abdomin: Soft, nontender, nondistended with good bowel sounds.  No hepatosplenomegaly. ?Musculoskeletal: No joint deformity or effusion.  Full range of motion noted. ?Neurological: No deficits noted on motor,  sensory and deep tendon reflex exam. ?Skin: No petechial rash or dryness.  Appeared moist.  ? ?Lab Results: ?Lab Results  ?Component Value Date  ? WBC 10.2 10/27/2016  ? HGB 13.9 10/27/2016  ? HCT 40.3 10/27/2016  ? MCV 89.8 10/27/2016  ? PLT 152 10/27/2016  ? ?  Chemistry   ?   ?Component Value Date/Time  ? NA 139 10/27/2016 1536  ? K 4.1 10/27/2016 1536  ? CO2 25 10/27/2016 1536  ? BUN 13.2 10/27/2016 1536  ? CREATININE 0.8 10/27/2016 1536  ?    ?Component Value Date/Time  ? CALCIUM 9.6 10/27/2016 1536  ? ALKPHOS 45 10/27/2016 1536  ? AST 21 10/27/2016 1536  ? ALT 21 10/27/2016 1536  ? BILITOT 0.36 10/27/2016 1536  ?  ? ? ? ?41 year old gentleman with the following issues:  ? ?1.  Nonseminomatous left testicular testicular cancer diagnosed in 2015.  He was found to have embryonal component 30% without lymphovascular invasion with clinical staging is T1 N0.  ? ?His disease status was updated at this time and risk of relapse was assessed.  He is over 8 years out from his surgery without any evidence of to suggest a relapse.  I have recommended updating his tumor markers today since he was on hiatus from any surveillance since 2017.  If he has no evidence of disease by tumor marker criteria do not recommend any additional imaging at this time.  It is likely he is cured from his cancer and does not require any intervention or follow-up. ? ?Salvage therapy options including systemic chemotherapy or RPLND are not required at this time unless he has relapsed disease. ? ?  2. Follow-up: Will be as needed in the future.  This will change if his tumor markers are elevated. ? ?20  minutes were dedicated to this visit. The time was spent on reviewing his disease status, pathology results, assessing risk of relapse and future plan of care review. ? ? ? ?Zola Button, MD ?5/12/20239:52 AM ?

## 2022-04-03 LAB — AFP TUMOR MARKER: AFP, Serum, Tumor Marker: 3.9 ng/mL (ref 0.0–6.9)

## 2022-04-03 LAB — BETA HCG QUANT (REF LAB): hCG Quant: <1 m[IU]/mL (ref 0–3)
